# Patient Record
Sex: Male | Born: 1950 | Race: White | Hispanic: No | Marital: Married | Smoking: Never smoker
Health system: Southern US, Community
[De-identification: ages and names within clinical notes are randomized; demographics above are authoritative.]

## PROBLEM LIST (undated history)

## (undated) DIAGNOSIS — N400 Enlarged prostate without lower urinary tract symptoms: Secondary | ICD-10-CM

## (undated) DIAGNOSIS — F419 Anxiety disorder, unspecified: Secondary | ICD-10-CM

## (undated) DIAGNOSIS — I10 Essential (primary) hypertension: Secondary | ICD-10-CM

## (undated) DIAGNOSIS — K2210 Ulcer of esophagus without bleeding: Secondary | ICD-10-CM

## (undated) DIAGNOSIS — G20 Parkinson's disease: Secondary | ICD-10-CM

## (undated) DIAGNOSIS — I219 Acute myocardial infarction, unspecified: Secondary | ICD-10-CM

## (undated) DIAGNOSIS — I2510 Atherosclerotic heart disease of native coronary artery without angina pectoris: Secondary | ICD-10-CM

## (undated) DIAGNOSIS — G2581 Restless legs syndrome: Secondary | ICD-10-CM

## (undated) DIAGNOSIS — R7302 Impaired glucose tolerance (oral): Secondary | ICD-10-CM

## (undated) DIAGNOSIS — R7989 Other specified abnormal findings of blood chemistry: Secondary | ICD-10-CM

## (undated) DIAGNOSIS — F4321 Adjustment disorder with depressed mood: Secondary | ICD-10-CM

## (undated) DIAGNOSIS — K5790 Diverticulosis of intestine, part unspecified, without perforation or abscess without bleeding: Secondary | ICD-10-CM

## (undated) DIAGNOSIS — K219 Gastro-esophageal reflux disease without esophagitis: Secondary | ICD-10-CM

## (undated) DIAGNOSIS — E785 Hyperlipidemia, unspecified: Secondary | ICD-10-CM

## (undated) DIAGNOSIS — G5600 Carpal tunnel syndrome, unspecified upper limb: Secondary | ICD-10-CM

## (undated) DIAGNOSIS — G248 Other dystonia: Secondary | ICD-10-CM

## (undated) DIAGNOSIS — G249 Dystonia, unspecified: Secondary | ICD-10-CM

## (undated) DIAGNOSIS — G5603 Carpal tunnel syndrome, bilateral upper limbs: Secondary | ICD-10-CM

## (undated) DIAGNOSIS — R600 Localized edema: Secondary | ICD-10-CM

## (undated) DIAGNOSIS — Z8719 Personal history of other diseases of the digestive system: Secondary | ICD-10-CM

## (undated) DIAGNOSIS — N289 Disorder of kidney and ureter, unspecified: Secondary | ICD-10-CM

## (undated) DIAGNOSIS — E119 Type 2 diabetes mellitus without complications: Secondary | ICD-10-CM

## (undated) DIAGNOSIS — R1900 Intra-abdominal and pelvic swelling, mass and lump, unspecified site: Secondary | ICD-10-CM

## (undated) DIAGNOSIS — Z87442 Personal history of urinary calculi: Secondary | ICD-10-CM

## (undated) HISTORY — DX: Carpal tunnel syndrome, unspecified upper limb: G56.00

## (undated) HISTORY — PX: NEUROPLASTY / TRANSPOSITION MEDIAN NERVE AT CARPAL TUNNEL: SUR893

## (undated) HISTORY — PX: CORONARY ARTERY BYPASS GRAFT: SHX141

## (undated) HISTORY — DX: Gastro-esophageal reflux disease without esophagitis: K21.9

## (undated) HISTORY — PX: HERNIA REPAIR: SHX51

## (undated) HISTORY — PX: LITHOTRIPSY: SUR834

## (undated) HISTORY — DX: Hyperlipidemia, unspecified: E78.5

## (undated) HISTORY — PX: CARDIAC CATHETERIZATION: SHX172

## (undated) HISTORY — PX: OTHER SURGICAL HISTORY: SHX169

## (undated) HISTORY — DX: Atherosclerotic heart disease of native coronary artery without angina pectoris: I25.10

## (undated) HISTORY — DX: Other dystonia: G24.8

## (undated) HISTORY — DX: Parkinson's disease: G20

## (undated) HISTORY — DX: Restless legs syndrome: G25.81

## (undated) HISTORY — PX: HAND SURGERY: SHX662

---

## 2003-09-30 ENCOUNTER — Other Ambulatory Visit: Payer: Self-pay

## 2007-05-24 ENCOUNTER — Ambulatory Visit: Payer: Self-pay | Admitting: Gastroenterology

## 2008-01-24 ENCOUNTER — Ambulatory Visit: Payer: Self-pay | Admitting: Orthopedic Surgery

## 2008-01-31 ENCOUNTER — Ambulatory Visit: Payer: Self-pay | Admitting: Orthopedic Surgery

## 2009-10-20 ENCOUNTER — Ambulatory Visit: Payer: Self-pay | Admitting: Surgery

## 2009-10-27 ENCOUNTER — Ambulatory Visit: Payer: Self-pay | Admitting: Surgery

## 2010-05-07 ENCOUNTER — Ambulatory Visit: Payer: Self-pay | Admitting: Urology

## 2010-05-25 ENCOUNTER — Ambulatory Visit: Payer: Self-pay | Admitting: Urology

## 2011-03-07 DIAGNOSIS — Z9889 Other specified postprocedural states: Secondary | ICD-10-CM | POA: Insufficient documentation

## 2011-03-07 DIAGNOSIS — G20 Parkinson's disease: Secondary | ICD-10-CM

## 2011-03-07 DIAGNOSIS — G20A1 Parkinson's disease without dyskinesia, without mention of fluctuations: Secondary | ICD-10-CM

## 2011-03-07 DIAGNOSIS — I2510 Atherosclerotic heart disease of native coronary artery without angina pectoris: Secondary | ICD-10-CM

## 2011-03-07 DIAGNOSIS — K219 Gastro-esophageal reflux disease without esophagitis: Secondary | ICD-10-CM

## 2011-03-07 DIAGNOSIS — I10 Essential (primary) hypertension: Secondary | ICD-10-CM | POA: Insufficient documentation

## 2011-03-07 HISTORY — DX: Atherosclerotic heart disease of native coronary artery without angina pectoris: I25.10

## 2011-03-07 HISTORY — DX: Parkinson's disease: G20

## 2011-03-07 HISTORY — DX: Gastro-esophageal reflux disease without esophagitis: K21.9

## 2011-03-07 HISTORY — DX: Parkinson's disease without dyskinesia, without mention of fluctuations: G20.A1

## 2011-03-23 ENCOUNTER — Encounter: Payer: Self-pay | Admitting: Internal Medicine

## 2011-03-25 ENCOUNTER — Encounter: Payer: Self-pay | Admitting: Internal Medicine

## 2011-04-22 ENCOUNTER — Encounter: Payer: Self-pay | Admitting: Internal Medicine

## 2011-04-29 DIAGNOSIS — E785 Hyperlipidemia, unspecified: Secondary | ICD-10-CM

## 2011-04-29 HISTORY — DX: Hyperlipidemia, unspecified: E78.5

## 2011-05-23 ENCOUNTER — Encounter: Payer: Self-pay | Admitting: Internal Medicine

## 2012-03-21 DIAGNOSIS — G2581 Restless legs syndrome: Secondary | ICD-10-CM

## 2012-03-21 HISTORY — DX: Restless legs syndrome: G25.81

## 2012-11-13 ENCOUNTER — Ambulatory Visit: Payer: Self-pay | Admitting: Urology

## 2014-03-12 DIAGNOSIS — M79642 Pain in left hand: Secondary | ICD-10-CM | POA: Insufficient documentation

## 2014-03-12 DIAGNOSIS — G248 Other dystonia: Secondary | ICD-10-CM | POA: Insufficient documentation

## 2014-03-12 HISTORY — DX: Other dystonia: G24.8

## 2014-03-26 DIAGNOSIS — G5600 Carpal tunnel syndrome, unspecified upper limb: Secondary | ICD-10-CM

## 2014-03-26 HISTORY — DX: Carpal tunnel syndrome, unspecified upper limb: G56.00

## 2014-09-01 ENCOUNTER — Other Ambulatory Visit: Payer: Self-pay | Admitting: Gastroenterology

## 2014-09-01 DIAGNOSIS — R1310 Dysphagia, unspecified: Secondary | ICD-10-CM

## 2014-09-03 ENCOUNTER — Ambulatory Visit
Admission: RE | Admit: 2014-09-03 | Discharge: 2014-09-03 | Disposition: A | Discharge status: Home / Self Care | Payer: Managed Care, Other (non HMO) | Source: Ambulatory Visit | Attending: Gastroenterology | Admitting: Gastroenterology

## 2014-09-03 DIAGNOSIS — R1310 Dysphagia, unspecified: Secondary | ICD-10-CM

## 2014-09-03 DIAGNOSIS — K449 Diaphragmatic hernia without obstruction or gangrene: Secondary | ICD-10-CM | POA: Insufficient documentation

## 2014-09-03 DIAGNOSIS — R1314 Dysphagia, pharyngoesophageal phase: Secondary | ICD-10-CM | POA: Diagnosis present

## 2014-09-03 DIAGNOSIS — K228 Other specified diseases of esophagus: Secondary | ICD-10-CM | POA: Diagnosis not present

## 2014-09-26 ENCOUNTER — Encounter: Payer: Self-pay | Admitting: *Deleted

## 2014-09-29 ENCOUNTER — Encounter: Payer: Self-pay | Admitting: *Deleted

## 2014-09-29 ENCOUNTER — Ambulatory Visit
Admission: RE | Admit: 2014-09-29 | Discharge: 2014-09-29 | Disposition: A | Discharge status: Home Health | Payer: Managed Care, Other (non HMO) | Source: Ambulatory Visit | Attending: Gastroenterology | Admitting: Gastroenterology

## 2014-09-29 ENCOUNTER — Ambulatory Visit: Payer: Managed Care, Other (non HMO) | Admitting: Anesthesiology

## 2014-09-29 ENCOUNTER — Encounter
Admission: RE | Disposition: A | Discharge status: Home Health | Payer: Self-pay | Source: Ambulatory Visit | Attending: Gastroenterology

## 2014-09-29 DIAGNOSIS — K222 Esophageal obstruction: Secondary | ICD-10-CM | POA: Diagnosis not present

## 2014-09-29 DIAGNOSIS — Z880 Allergy status to penicillin: Secondary | ICD-10-CM | POA: Diagnosis not present

## 2014-09-29 DIAGNOSIS — K621 Rectal polyp: Secondary | ICD-10-CM | POA: Insufficient documentation

## 2014-09-29 DIAGNOSIS — K317 Polyp of stomach and duodenum: Secondary | ICD-10-CM | POA: Insufficient documentation

## 2014-09-29 DIAGNOSIS — D123 Benign neoplasm of transverse colon: Secondary | ICD-10-CM | POA: Diagnosis not present

## 2014-09-29 DIAGNOSIS — Z1211 Encounter for screening for malignant neoplasm of colon: Secondary | ICD-10-CM | POA: Insufficient documentation

## 2014-09-29 DIAGNOSIS — I10 Essential (primary) hypertension: Secondary | ICD-10-CM | POA: Insufficient documentation

## 2014-09-29 DIAGNOSIS — I2510 Atherosclerotic heart disease of native coronary artery without angina pectoris: Secondary | ICD-10-CM | POA: Insufficient documentation

## 2014-09-29 DIAGNOSIS — R1310 Dysphagia, unspecified: Secondary | ICD-10-CM | POA: Diagnosis present

## 2014-09-29 DIAGNOSIS — G20 Parkinson's disease: Secondary | ICD-10-CM | POA: Insufficient documentation

## 2014-09-29 DIAGNOSIS — E785 Hyperlipidemia, unspecified: Secondary | ICD-10-CM | POA: Diagnosis not present

## 2014-09-29 DIAGNOSIS — K219 Gastro-esophageal reflux disease without esophagitis: Secondary | ICD-10-CM | POA: Diagnosis not present

## 2014-09-29 HISTORY — DX: Essential (primary) hypertension: I10

## 2014-09-29 HISTORY — DX: Restless legs syndrome: G25.81

## 2014-09-29 HISTORY — PX: ESOPHAGOGASTRODUODENOSCOPY (EGD) WITH PROPOFOL: SHX5813

## 2014-09-29 HISTORY — PX: COLONOSCOPY WITH PROPOFOL: SHX5780

## 2014-09-29 HISTORY — DX: Gastro-esophageal reflux disease without esophagitis: K21.9

## 2014-09-29 HISTORY — DX: Atherosclerotic heart disease of native coronary artery without angina pectoris: I25.10

## 2014-09-29 HISTORY — DX: Parkinson's disease: G20

## 2014-09-29 HISTORY — DX: Hyperlipidemia, unspecified: E78.5

## 2014-09-29 SURGERY — COLONOSCOPY WITH PROPOFOL
Anesthesia: General

## 2014-09-29 MED ORDER — SODIUM CHLORIDE 0.9 % IV SOLN: INTRAVENOUS | Status: DC

## 2014-09-29 MED ORDER — PROPOFOL INFUSION 10 MG/ML OPTIME
INTRAVENOUS | Status: DC | PRN
  Administered 2014-09-29: 140 ug/kg/min via INTRAVENOUS

## 2014-09-29 MED ORDER — GLYCOPYRROLATE 0.2 MG/ML IJ SOLN
INTRAMUSCULAR | Status: DC | PRN
  Administered 2014-09-29: 0.2 mg via INTRAVENOUS

## 2014-09-29 MED ORDER — SODIUM CHLORIDE 0.9 % IV SOLN
INTRAVENOUS | Status: DC
  Administered 2014-09-29: 14:00:00 via INTRAVENOUS

## 2014-09-29 MED ORDER — EPHEDRINE SULFATE 50 MG/ML IJ SOLN
INTRAMUSCULAR | Status: DC | PRN
  Administered 2014-09-29: 5 mg via INTRAVENOUS
  Administered 2014-09-29 (×2): 10 mg via INTRAVENOUS

## 2014-09-29 MED ORDER — FENTANYL CITRATE (PF) 100 MCG/2ML IJ SOLN
INTRAMUSCULAR | Status: DC | PRN
  Administered 2014-09-29: 50 ug via INTRAVENOUS

## 2014-09-29 NOTE — Anesthesia Preprocedure Evaluation (Addendum)
Anesthesia Evaluation  Patient identified by MRN, date of birth, ID band Patient awake    Reviewed: Allergy & Precautions, H&P , NPO status , Patient's Chart, lab work & pertinent test results, reviewed documented beta blocker date and time   History of Anesthesia Complications Negative for: history of anesthetic complications  Airway Mallampati: II  TM Distance: >3 FB Neck ROM: full    Dental no notable dental hx. (+) Partial Upper, Partial Lower   Pulmonary neg pulmonary ROS,  breath sounds clear to auscultation  Pulmonary exam normal       Cardiovascular Exercise Tolerance: Good hypertension, On Medications and On Home Beta Blockers - angina+ CAD, + Past MI and + CABG - Cardiac Stents and - CHF Normal cardiovascular exam- Valvular Problems/MurmursRhythm:regular Rate:Normal  Known RBBB   Neuro/Psych Parkinson's disease and Restless leg syndrome negative psych ROS   GI/Hepatic Neg liver ROS, GERD-  Medicated and Controlled,  Endo/Other  negative endocrine ROS  Renal/GU negative Renal ROS  negative genitourinary   Musculoskeletal   Abdominal   Peds  Hematology negative hematology ROS (+)   Anesthesia Other Findings Past Medical History:   Coronary artery disease                                      Hypertension                                                 GERD (gastroesophageal reflux disease)                       Hyperlipidemia                                               Parkinson disease                                            RLS (restless legs syndrome)                                 Reproductive/Obstetrics negative OB ROS                            Anesthesia Physical Anesthesia Plan  ASA: III  Anesthesia Plan: General   Post-op Pain Management:    Induction:   Airway Management Planned:   Additional Equipment:   Intra-op Plan:   Post-operative Plan:    Informed Consent: I have reviewed the patients History and Physical, chart, labs and discussed the procedure including the risks, benefits and alternatives for the proposed anesthesia with the patient or authorized representative who has indicated his/her understanding and acceptance.   Dental Advisory Given  Plan Discussed with: Anesthesiologist, CRNA and Surgeon  Anesthesia Plan Comments:         Anesthesia Quick Evaluation

## 2014-09-29 NOTE — Transfer of Care (Signed)
Immediate Anesthesia Transfer of Care Note  Patient: Christian Thompson  Procedure(s) Performed: Procedure(s): COLONOSCOPY WITH PROPOFOL (N/A) ESOPHAGOGASTRODUODENOSCOPY (EGD) WITH PROPOFOL (N/A)  Patient Location: PACU and Endoscopy Unit  Anesthesia Type:General  Level of Consciousness: sedated and responds to stimulation  Airway & Oxygen Therapy: Patient Spontanous Breathing and Patient connected to nasal cannula oxygen  Post-op Assessment: Report given to RN and Post -op Vital signs reviewed and stable  Post vital signs: Reviewed  Last Vitals:  Filed Vitals:   09/29/14 1608  BP: 97/59  Pulse: 57  Temp: 36.4 C  Resp: 16    Complications: No apparent anesthesia complications

## 2014-09-29 NOTE — Anesthesia Postprocedure Evaluation (Signed)
  Anesthesia Post-op Note  Patient: Christian Thompson  Procedure(s) Performed: Procedure(s): COLONOSCOPY WITH PROPOFOL (N/A) ESOPHAGOGASTRODUODENOSCOPY (EGD) WITH PROPOFOL (N/A)  Anesthesia type:General  Patient location: PACU  Post pain: Pain level controlled  Post assessment: Post-op Vital signs reviewed, Patient's Cardiovascular Status Stable, Respiratory Function Stable, Patent Airway and No signs of Nausea or vomiting  Post vital signs: Reviewed and stable  Last Vitals:  Filed Vitals:   09/29/14 1640  BP: 94/66  Pulse: 53  Temp:   Resp: 20    Level of consciousness: awake, alert  and patient cooperative  Complications: No apparent anesthesia complications

## 2014-09-29 NOTE — Op Note (Signed)
[facility] Gastroenterology Patient Name: Christian Thompson Procedure Date: 09/29/2014 2:42 PM MRN: [id number] Account #: [id number] Date of Birth: October 31, 1950 Admit Type: Outpatient Age: 64 Room: [facility] ENDO ROOM 3 Gender: Male Note Status: Finalized Procedure:         Colonoscopy Indications:       This is the patient's first colonoscopy Providers:         [name], MD Referring MD:      Shirline Frees (Referring MD) Medicines:         Monitored Anesthesia Care Complications:     No immediate complications. Procedure:         Pre-Anesthesia Assessment:                    - ASA Grade Assessment: III - A patient with severe                     systemic disease.                    After obtaining informed consent, the colonoscope was                     passed under direct vision. Throughout the procedure, the                     patient's blood pressure, pulse, and oxygen saturations                     were monitored continuously. The Olympus PCF-H180AL                     colonoscope ( S#: [id number] ) was introduced through the                     anus and advanced to the the cecum, identified by                     appendiceal orifice and ileocecal valve. The colonoscopy                     was performed without difficulty. The patient tolerated                     the procedure well. The quality of the bowel preparation                     was fair. Findings:      Multiple small-mouthed diverticula were found in the sigmoid colon and       in the distal descending colon.      A single medium-mouthed diverticulum was found at the splenic flexure.      A 5 mm polyp was found in the descending colon. The polyp was sessile.       The polyp was removed with a cold snare. Resection and retrieval were       complete.      A 2 mm polyp was found in the proximal sigmoid colon. The polyp was       sessile. The polyp was removed with a cold biopsy forceps.  Resection and       retrieval were complete.      Four sessile polyps were found in the distal sigmoid colon. The polyps       were 1 to 3 mm in  size. These polyps were removed with a cold biopsy       forceps. Resection and retrieval were complete.      A 3 mm polyp was found in the rectum. The polyp was sessile. The polyp       was removed with a cold biopsy forceps. Resection and retrieval were       complete.      The digital rectal exam was normal.      The retroflexed view of the distal rectum and anal verge was normal and       showed no anal or rectal abnormalities. Impression:        - Diverticulosis in the sigmoid colon and in the distal                     descending colon.                    - Diverticulosis at the splenic flexure.                    - One 5 mm polyp in the descending colon. Resected and                     retrieved.                    - One 2 mm polyp in the proximal sigmoid colon. Resected                     and retrieved.                    - Four 1 to 3 mm polyps in the distal sigmoid colon.                     Resected and retrieved.                    - One 3 mm polyp in the rectum. Resected and retrieved.                    - The distal rectum and anal verge are normal on                     retroflexion view. Recommendation:    - Await pathology results.                    - Telephone GI clinic for pathology results in 1 week.                    - Perform a modified barium swallow at appointment to be                     scheduled. Procedure Code(s): --- Professional ---                    [phone], Colonoscopy, flexible; with removal of tumor(s),                     polyp(s), or other lesion(s) by snare technique                    69485, 70, Colonoscopy, flexible; with biopsy, single or  multiple Diagnosis Code(s): --- Professional ---                    211.3, Benign neoplasm of colon                    569.0, Anal and rectal  polyp                    562.10, Diverticulosis of colon (without mention of                     hemorrhage) CPT copyright 2014 American Medical Association. All rights reserved. The codes documented in this report are preliminary and upon coder review may  be revised to meet current compliance requirements. [name], MD 09/29/2014 4:07:20 PM This report has been signed electronically. Number of Addenda: 0 Note Initiated On: 09/29/2014 2:42 PM Scope Withdrawal Time: 0 hours 16 minutes 31 seconds  Total Procedure Duration: 0 hours 26 minutes 3 seconds       [facility]

## 2014-09-29 NOTE — Op Note (Signed)
[facility] Gastroenterology Patient Name: Christian Thompson Procedure Date: 09/29/2014 2:43 PM MRN: [id number] Account #: [id number] Date of Birth: [birth year] Admit Type: Outpatient Age: 64 Room: [facility] ENDO ROOM 3 Gender: Male Note Status: Finalized Procedure:         Upper GI endoscopy Indications:       Dysphagia Providers:         [name], MD Referring MD:      Shirline Frees (Referring MD) Medicines:         Monitored Anesthesia Care Complications:     No immediate complications. Procedure:         Pre-Anesthesia Assessment:                    - ASA Grade Assessment: III - A patient with severe                     systemic disease.                    After obtaining informed consent, the endoscope was passed                     under direct vision. Throughout the procedure, the                     patient's blood pressure, pulse, and oxygen saturations                     were monitored continuously. The Olympus GIF-160 endoscope                     (S#. [id number]) was introduced through the mouth, and                     advanced to the third part of duodenum. The upper GI                     endoscopy was accomplished without difficulty. The patient                     tolerated the procedure well. Findings:      Abnormal motility was noted in the middle third of the esophagus and in       the lower third of the esophagus. The cricopharyngeus was normal. There       is spasticity of the esophageal body. The distal esophagus/lower       esophageal sphincter is spastic, but gives up passage to the endoscope.       Tertiary peristaltic waves are noted.      A low-grade of narrowing Schatzki ring (acquired) was found at the       gastroesophageal junction. A TTS dilator was passed through the scope.       Dilation with a 12-13.5-15 mm balloon (to a maximum balloon size of 15       mm) dilator was performed.      LA Grade A (one or more mucosal breaks  less than 5 mm, not extending       between tops of 2 mucosal folds) esophagitis with no bleeding was found.       Biopsies were taken with a cold forceps for histology.      A single 11 mm pedunculated polyp with erythema and no stigmata of       recent bleeding  was found on the posterior wall of the gastric body.       Biopsies were taken with a cold forceps for histology. The polyp was       removed with a cold biopsy forceps. Resection and retrieval were       complete. One hemostatic clip was successfully placed.      A small hiatus hernia was found. The Z-line was a variable distance from       incisors; the hiatal hernia was sliding. 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                    - Low-grade of narrowing Schatzki ring. Dilated.                    - LA Grade A erosive esophagitis. Biopsied.                    - A single gastric polyp. Resected and retrieved.                     Biopsied. Clip was placed.                    - Small hiatus hernia. Recommendation:    - Perform a colonoscopy today.                    - Use Protonix (pantoprazole) 40 mg PO BID for 1 week.                    - Use Protonix (pantoprazole) 40 mg PO daily daily.                    - Return to GI clinic in 4 weeks. Procedure Code(s): --- Professional ---                    [phone], Esophagogastroduodenoscopy, flexible, transoral;                     with transendoscopic balloon dilation of esophagus (less                     than 30 mm diameter)                    43239, Esophagogastroduodenoscopy, flexible, transoral;                     with biopsy, single or multiple Diagnosis Code(s): --- Professional ---                    530.3, Stricture and stenosis of esophagus                    530.19, Other esophagitis                    211.1, Benign neoplasm of stomach                    787.20, Dysphagia, unspecified                     553.3, Diaphragmatic hernia without mention of obstruction  or gangrene CPT copyright 2014 American Medical Association. All rights reserved. The codes documented in this report are preliminary and upon coder review may  be revised to meet current compliance requirements. [name], MD 09/29/2014 3:32:09 PM This report has been signed electronically. Number of Addenda: 0 Note Initiated On: 09/29/2014 2:43 PM      [facility]

## 2014-09-30 ENCOUNTER — Encounter: Payer: Self-pay | Admitting: Gastroenterology

## 2014-09-30 NOTE — H&P (Signed)
Outpatient short stay form Pre-procedure 09/30/2014 5:27 PM Christian Sails MD  Primary Physician: Dr [name]  Reason for visit:  EGD and colonoscopy  History of present illness:  Patient is a 64 year old male setting for EGD and colonoscopy. His is in regards to issues with dysphagia and GERD as well as colon cancer screening. He has a history of or Parkinson's disease he tolerated his prep well. He takes no anticoagulates or aspirin-type medications.   No current facility-administered medications for this encounter.  Current outpatient prescriptions:  .  amitriptyline (ELAVIL) 10 MG tablet, Take 10 mg by mouth at bedtime., Disp: , Rfl:  .  atorvastatin (LIPITOR) 80 MG tablet, Take 80 mg by mouth daily., Disp: , Rfl:  .  carbidopa-levodopa (SINEMET IR) 25-100 MG per tablet, Take 1 tablet by mouth 3 (three) times daily., Disp: , Rfl:  .  citalopram (CELEXA) 10 MG tablet, Take 10 mg by mouth daily., Disp: , Rfl:  .  cyanocobalamin 1000 MCG tablet, Take 100 mcg by mouth daily., Disp: , Rfl:  .  furosemide (LASIX) 20 MG tablet, Take 20 mg by mouth., Disp: , Rfl:  .  glucosamine-chondroitin 500-400 MG tablet, Take 1 tablet by mouth 3 (three) times daily., Disp: , Rfl:  .  lisinopril (PRINIVIL,ZESTRIL) 5 MG tablet, Take 5 mg by mouth daily., Disp: , Rfl:  .  metoprolol succinate (TOPROL-XL) 25 MG 24 hr tablet, Take 25 mg by mouth daily., Disp: , Rfl:  .  Multiple Vitamin (MULTIVITAMIN) tablet, Take 1 tablet by mouth daily., Disp: , Rfl:  .  nitroGLYCERIN (NITROSTAT) 0.4 MG SL tablet, Place 0.4 mg under the tongue every 5 (five) minutes as needed for chest pain., Disp: , Rfl:  .  omega-3 acid ethyl esters (LOVAZA) 1 G capsule, Take by mouth 2 (two) times daily., Disp: , Rfl:  .  pantoprazole (PROTONIX) 40 MG tablet, Take 40 mg by mouth daily., Disp: , Rfl:  .  rOPINIRole (REQUIP) 0.5 MG tablet, Take 0.5 mg by mouth 3 (three) times daily., Disp: , Rfl:  .  vitamin C (ASCORBIC ACID) 500 MG  tablet, Take 500 mg by mouth daily., Disp: , Rfl:   No prescriptions prior to admission     Allergies  Allergen Reactions  . Penicillins   . Tramadol      Past Medical History  Diagnosis Date  . Coronary artery disease   . Hypertension   . GERD (gastroesophageal reflux disease)   . Hyperlipidemia   . Parkinson disease   . RLS (restless legs syndrome)     Review of systems:      Physical Exam    Heart and lungs: Bilaterally clear to auscultation, regular rate and rhythm without rub or gallop    HEENT: Norm cephalic atraumatic eyes are anicteric    Other:     Pertinant exam for procedure: Soft nontender nondistended bowel sounds positive normoactive    Planned proceedures: EGD, colonoscopy and indicated procedures. I have discussed the risks benefits and complications of procedures to include not limited to bleeding, infection, perforation and the risk of sedation and the patient wishes to proceed.    Christian Sails, MD Gastroenterology 09/30/2014  5:27 PM

## 2014-10-01 LAB — SURGICAL PATHOLOGY

## 2014-10-06 ENCOUNTER — Other Ambulatory Visit: Payer: Self-pay | Admitting: Gastroenterology

## 2014-10-06 DIAGNOSIS — K228 Other specified diseases of esophagus: Secondary | ICD-10-CM

## 2014-10-06 DIAGNOSIS — K2289 Other specified disease of esophagus: Secondary | ICD-10-CM

## 2014-10-15 ENCOUNTER — Ambulatory Visit
Admission: RE | Admit: 2014-10-15 | Discharge: 2014-10-15 | Disposition: A | Discharge status: Home / Self Care | Payer: Managed Care, Other (non HMO) | Source: Ambulatory Visit | Attending: Gastroenterology | Admitting: Gastroenterology

## 2014-10-15 DIAGNOSIS — K2289 Other specified disease of esophagus: Secondary | ICD-10-CM

## 2014-10-15 DIAGNOSIS — R1314 Dysphagia, pharyngoesophageal phase: Secondary | ICD-10-CM

## 2014-10-15 DIAGNOSIS — R1310 Dysphagia, unspecified: Secondary | ICD-10-CM | POA: Diagnosis not present

## 2014-10-15 DIAGNOSIS — K228 Other specified diseases of esophagus: Secondary | ICD-10-CM

## 2014-10-15 NOTE — Therapy (Signed)
[place], [place], [address] Phone: [phone]   Fax:     Modified Barium Swallow  Patient Details  Name: Christian Thompson MRN: [id number] Date of Birth: 1950-04-29 Referring Provider:  [name], MD  Encounter Date: 10/15/2014      End of Session - 10/15/14 1333    Visit Number 1   Number of Visits 1   Date for SLP Re-Evaluation 10/15/14   SLP Start Time 41   SLP Stop Time  1310   SLP Time Calculation (min) 40 min   Activity Tolerance Patient tolerated treatment well      Past Medical History  Diagnosis Date  . Coronary artery disease   . Hypertension   . GERD (gastroesophageal reflux disease)   . Hyperlipidemia   . Parkinson disease   . RLS (restless legs syndrome)     Past Surgical History  Procedure Laterality Date  . Hernia repair    . Cardiac catheterization    . Coronary artery bypass graft    . Lithotripsy    . Hemmorrhoidectomy    . Hand surgery    . Neuroplasty / transposition median nerve at carpal tunnel Left   . Colonoscopy with propofol N/A 09/29/2014    Procedure: COLONOSCOPY WITH PROPOFOL;  Surgeon: [name], MD;  Location: [facility] ENDOSCOPY;  Service: Endoscopy;  Laterality: N/A;  . Esophagogastroduodenoscopy (egd) with propofol N/A 09/29/2014    Procedure: ESOPHAGOGASTRODUODENOSCOPY (EGD) WITH PROPOFOL;  Surgeon: [name], MD;  Location: [facility] ENDOSCOPY;  Service: Endoscopy;  Laterality: N/A;    There were no vitals filed for this visit.  Visit Diagnosis: Dysphagia, pharyngoesophageal phase  Presbyesophagus - Plan: DG SWALLOWING FUNC-SPEECH PATHOLOGY, DG SWALLOWING FUNC-SPEECH PATHOLOGY   Subjective: Patient behavior: (alertness, ability to follow instructions, etc.): Pt was pleasant and cooperative with evaluation.  Chief complaint: Pt reports that he coughs while eating and drinking and that it sometimes feels as if food becomes  lodged (indicated upper chest).   Objective:  Radiological Procedure: A videoflouroscopic evaluation of oral-preparatory, reflex initiation, and pharyngeal phases of the swallow was performed; as well as a screening of the upper esophageal phase.  I. POSTURE: Upright in flouro chair II. VIEW: Lateral and A-P III. COMPENSATORY STRATEGIES: None indicated IV. BOLUSES ADMINISTERED:  Thin Liquid: 4 via cup and straw  Nectar-thick Liquid: 3 via cup  Honey-thick Liquid: Not tested  Puree: 3 tsps  Mechanical Soft: 1 cracker  BA pill: x1 V. RESULTS OF EVALUATION: A. ORAL PREPARATORY PHASE: (The lips, tongue, and velum are observed for strength and coordination) WFL for all tested consistencies       **Overall Severity Rating: WFL  B. SWALLOW INITIATION/REFLEX: (The reflex is normal if "triggered" by the time the bolus reached the base of the tongue) Swallow trigger at base of tongue for all tested consistencies  **Overall Severity Rating: WFL  C. PHARYNGEAL PHASE: (Pharyngeal function is normal if the bolus shows rapid, smooth, and continuous transit through the pharynx and there is no pharyngeal residue after the swallow)   **Overall Severity Rating: [facility] for all tested consistencies  D. LARYNGEAL PENETRATION: (Material entering into the laryngeal inlet/vestibule but not aspirated) None observed E. ASPIRATION: No aspiration observed F. ESOPHAGEAL PHASE: (Screening of the upper esophagus) Noted slow clearing of boluses through esophagus as well as poor clearing of BA pill. Pt was able to clear pill w/ f/u sips of liquid  ASSESSMENT: Pt presents w/oropharyngeal swallow  WFL and min risk of aspiration. No aspiration or laryngeal penetration was observed w/any tested consistency. Oral phase was [facility]. No pocketing or holding was observed and A-P transit was timely. Mastication of solid was adequate. No swallow delay was observed and no significant pharyngeal residue was observed. During screening of  upper esophagus it was observed that pt had slow clearing of boluses through esophagus and BA pill did not clear into stomach well. Pt was able to clear pill after several subsequent swallows of liquid. Recommend continue w/regular diet w/thin liquids. Discussed strategies with pt to aid in decreasing esophageal dysphagia including checking to see if his medications would be able to be crushed.  PLAN/RECOMMENDATIONS:  A. Diet: Regular w/thin  B. Swallowing Precautions: Check meds for crushability and give in puree, sit fully upright for at least 60 minutes post meal, small bites/sips, eat slowly.  C. Recommended continue f/u w/GI  D. Therapy recommendations not indicated  E. Results and recommendations were discussed w/pt.                              Problem List There are no active problems to display for this patient.   [place] 10/15/2014, 1:39 PM  [facility] [place], [place], [address] Phone: [phone]   Fax:

## 2015-05-13 ENCOUNTER — Other Ambulatory Visit: Payer: Self-pay | Admitting: Gastroenterology

## 2015-05-13 DIAGNOSIS — R1310 Dysphagia, unspecified: Secondary | ICD-10-CM

## 2015-05-20 ENCOUNTER — Ambulatory Visit
Admission: RE | Admit: 2015-05-20 | Discharge: 2015-05-20 | Disposition: A | Discharge status: Home / Self Care | Payer: Medicare Other | Source: Ambulatory Visit | Attending: Gastroenterology | Admitting: Gastroenterology

## 2015-05-20 DIAGNOSIS — K449 Diaphragmatic hernia without obstruction or gangrene: Secondary | ICD-10-CM | POA: Diagnosis not present

## 2015-05-20 DIAGNOSIS — K219 Gastro-esophageal reflux disease without esophagitis: Secondary | ICD-10-CM | POA: Insufficient documentation

## 2015-05-20 DIAGNOSIS — K222 Esophageal obstruction: Secondary | ICD-10-CM | POA: Diagnosis not present

## 2015-05-20 DIAGNOSIS — R1310 Dysphagia, unspecified: Secondary | ICD-10-CM | POA: Diagnosis not present

## 2015-05-20 DIAGNOSIS — R05 Cough: Secondary | ICD-10-CM | POA: Diagnosis not present

## 2015-05-28 ENCOUNTER — Ambulatory Visit
Admit: 2015-05-28 | Discharge: 2015-05-28 | Disposition: A | Discharge status: Home / Self Care | Payer: Medicare Other | Attending: Family Medicine | Admitting: Family Medicine

## 2015-05-28 ENCOUNTER — Ambulatory Visit
Admission: EM | Admit: 2015-05-28 | Discharge: 2015-05-28 | Disposition: A | Discharge status: Home / Self Care | Payer: Medicare Other | Attending: Family Medicine | Admitting: Family Medicine

## 2015-05-28 ENCOUNTER — Encounter: Payer: Self-pay | Admitting: *Deleted

## 2015-05-28 DIAGNOSIS — K219 Gastro-esophageal reflux disease without esophagitis: Secondary | ICD-10-CM | POA: Insufficient documentation

## 2015-05-28 DIAGNOSIS — R1032 Left lower quadrant pain: Secondary | ICD-10-CM | POA: Diagnosis not present

## 2015-05-28 DIAGNOSIS — G20 Parkinson's disease: Secondary | ICD-10-CM | POA: Diagnosis not present

## 2015-05-28 DIAGNOSIS — N201 Calculus of ureter: Secondary | ICD-10-CM | POA: Diagnosis not present

## 2015-05-28 DIAGNOSIS — R310 Gross hematuria: Secondary | ICD-10-CM | POA: Insufficient documentation

## 2015-05-28 DIAGNOSIS — Z951 Presence of aortocoronary bypass graft: Secondary | ICD-10-CM | POA: Diagnosis not present

## 2015-05-28 DIAGNOSIS — N132 Hydronephrosis with renal and ureteral calculous obstruction: Secondary | ICD-10-CM | POA: Diagnosis not present

## 2015-05-28 DIAGNOSIS — G2581 Restless legs syndrome: Secondary | ICD-10-CM | POA: Diagnosis not present

## 2015-05-28 DIAGNOSIS — Z9889 Other specified postprocedural states: Secondary | ICD-10-CM | POA: Diagnosis not present

## 2015-05-28 DIAGNOSIS — Z87442 Personal history of urinary calculi: Secondary | ICD-10-CM | POA: Insufficient documentation

## 2015-05-28 DIAGNOSIS — Z79899 Other long term (current) drug therapy: Secondary | ICD-10-CM | POA: Insufficient documentation

## 2015-05-28 DIAGNOSIS — N400 Enlarged prostate without lower urinary tract symptoms: Secondary | ICD-10-CM | POA: Diagnosis not present

## 2015-05-28 DIAGNOSIS — I10 Essential (primary) hypertension: Secondary | ICD-10-CM | POA: Insufficient documentation

## 2015-05-28 DIAGNOSIS — I700 Atherosclerosis of aorta: Secondary | ICD-10-CM | POA: Diagnosis not present

## 2015-05-28 DIAGNOSIS — Z888 Allergy status to other drugs, medicaments and biological substances status: Secondary | ICD-10-CM | POA: Insufficient documentation

## 2015-05-28 DIAGNOSIS — Z880 Allergy status to penicillin: Secondary | ICD-10-CM | POA: Diagnosis not present

## 2015-05-28 DIAGNOSIS — E785 Hyperlipidemia, unspecified: Secondary | ICD-10-CM | POA: Insufficient documentation

## 2015-05-28 DIAGNOSIS — N390 Urinary tract infection, site not specified: Secondary | ICD-10-CM

## 2015-05-28 DIAGNOSIS — I2510 Atherosclerotic heart disease of native coronary artery without angina pectoris: Secondary | ICD-10-CM | POA: Insufficient documentation

## 2015-05-28 DIAGNOSIS — N200 Calculus of kidney: Secondary | ICD-10-CM | POA: Diagnosis not present

## 2015-05-28 DIAGNOSIS — R109 Unspecified abdominal pain: Secondary | ICD-10-CM | POA: Diagnosis present

## 2015-05-28 HISTORY — DX: Disorder of kidney and ureter, unspecified: N28.9

## 2015-05-28 LAB — CBC WITH DIFFERENTIAL/PLATELET
BASOS PCT: 0 %
Basophils Absolute: 0 10*3/uL (ref 0–0.1)
EOS ABS: 0 10*3/uL (ref 0–0.7)
EOS PCT: 0 %
HCT: 43.8 % (ref 40.0–52.0)
Hemoglobin: 14.8 g/dL (ref 13.0–18.0)
LYMPHS ABS: 1.1 10*3/uL (ref 1.0–3.6)
Lymphocytes Relative: 9 %
MCH: 29.6 pg (ref 26.0–34.0)
MCHC: 33.8 g/dL (ref 32.0–36.0)
MCV: 87.5 fL (ref 80.0–100.0)
MONO ABS: 0.4 10*3/uL (ref 0.2–1.0)
MONOS PCT: 3 %
Neutro Abs: 10.6 10*3/uL — ABNORMAL HIGH (ref 1.4–6.5)
Neutrophils Relative %: 88 %
PLATELETS: 150 10*3/uL (ref 150–440)
RBC: 5 MIL/uL (ref 4.40–5.90)
RDW: 13.5 % (ref 11.5–14.5)
WBC: 12.1 10*3/uL — ABNORMAL HIGH (ref 3.8–10.6)

## 2015-05-28 LAB — COMPREHENSIVE METABOLIC PANEL
ALK PHOS: 76 U/L (ref 38–126)
ALT: 13 U/L — ABNORMAL LOW (ref 17–63)
ANION GAP: 5 (ref 5–15)
AST: 30 U/L (ref 15–41)
Albumin: 4.1 g/dL (ref 3.5–5.0)
BUN: 16 mg/dL (ref 6–20)
CALCIUM: 8.8 mg/dL — AB (ref 8.9–10.3)
CHLORIDE: 107 mmol/L (ref 101–111)
CO2: 26 mmol/L (ref 22–32)
Creatinine, Ser: 1.13 mg/dL (ref 0.61–1.24)
GFR calc non Af Amer: >60 mL/min (ref >60)
Glucose, Bld: 164 mg/dL — ABNORMAL HIGH (ref 65–99)
POTASSIUM: 3.6 mmol/L (ref 3.5–5.1)
SODIUM: 138 mmol/L (ref 135–145)
Total Bilirubin: 1.3 mg/dL — ABNORMAL HIGH (ref 0.3–1.2)
Total Protein: 7.2 g/dL (ref 6.5–8.1)

## 2015-05-28 LAB — URINALYSIS COMPLETE WITH MICROSCOPIC (ARMC ONLY)
BILIRUBIN URINE: NEGATIVE
GLUCOSE, UA: NEGATIVE mg/dL
Ketones, ur: NEGATIVE mg/dL
Leukocytes, UA: NEGATIVE
NITRITE: NEGATIVE
Specific Gravity, Urine: >1.03 — ABNORMAL HIGH (ref 1.005–1.030)
pH: 6 (ref 5.0–8.0)

## 2015-05-28 LAB — AMYLASE: AMYLASE: 83 U/L (ref 28–100)

## 2015-05-28 LAB — LIPASE, BLOOD: Lipase: 25 U/L (ref 11–51)

## 2015-05-28 MED ORDER — OXYCODONE-ACETAMINOPHEN 10-325 MG PO TABS: 1.0000 | ORAL_TABLET | Freq: Three times a day (TID) | ORAL | Status: DC | PRN

## 2015-05-28 MED ORDER — KETOROLAC TROMETHAMINE 60 MG/2ML IM SOLN
60.0000 mg | Freq: Once | INTRAMUSCULAR | Status: AC
  Administered 2015-05-28: 60 mg via INTRAMUSCULAR

## 2015-05-28 MED ORDER — ONDANSETRON 8 MG PO TBDP: 8.0000 mg | ORAL_TABLET | Freq: Three times a day (TID) | ORAL | Status: DC | PRN

## 2015-05-28 MED ORDER — CIPROFLOXACIN HCL 500 MG PO TABS: 500.0000 mg | ORAL_TABLET | Freq: Two times a day (BID) | ORAL | Status: DC

## 2015-05-28 NOTE — Discharge Instructions (Signed)
Kidney Stones Kidney stones (urolithiasis) are solid masses that form inside your kidneys. The intense pain is caused by the stone moving through the kidney, ureter, bladder, and urethra (urinary tract). When the stone moves, the ureter starts to spasm around the stone. The stone is usually passed in your pee (urine).  HOME CARE  Drink enough fluids to keep your pee clear or pale yellow. This helps to get the stone out.  Take a 24-hour pee (urine) sample as told by your doctor. You may need to take another sample every 6-12 months.  Strain all pee through the provided strainer. Do not pee without peeing through the strainer, not even once. If you pee the stone out, catch it in the strainer. The stone may be as small as a grain of salt. Take this to your doctor. This will help your doctor figure out what you can do to try to prevent more kidney stones.  Only take medicine as told by your doctor.  Make changes to your daily diet as told by your doctor. You may be told to:  Limit how much salt you eat.  Eat 5 or more servings of fruits and vegetables each day.  Limit how much meat, poultry, fish, and eggs you eat.  Keep all follow-up visits as told by your doctor. This is important.  Get follow-up X-rays as told by your doctor. GET HELP IF: You have pain that gets worse even if you have been taking pain medicine. GET HELP RIGHT AWAY IF:   Your pain does not get better with medicine.  You have a fever or shaking chills.  Your pain increases and gets worse over 18 hours.  You have new belly (abdominal) pain.  You feel faint or pass out.  You are unable to pee.   This information is not intended to replace advice given to you by your health care provider. Make sure you discuss any questions you have with your health care provider.   Document Released: 07/27/2007 Document Revised: 10/29/2014 Document Reviewed: 07/11/2012 Elsevier Interactive Patient Education 2016 Elsevier  Inc.  Urinary Tract Infection A urinary tract infection (UTI) can occur any place along the urinary tract. The tract includes the kidneys, ureters, bladder, and urethra. A type of germ called bacteria often causes a UTI. UTIs are often helped with antibiotic medicine.  HOME CARE   If given, take antibiotics as told by your doctor. Finish them even if you start to feel better.  Drink enough fluids to keep your pee (urine) clear or pale yellow.  Avoid tea, drinks with caffeine, and bubbly (carbonated) drinks.  Pee often. Avoid holding your pee in for a long time.  Pee before and after having sex (intercourse).  Wipe from front to back after you poop (bowel movement) if you are a woman. Use each tissue only once. GET HELP RIGHT AWAY IF:   You have back pain.  You have lower belly (abdominal) pain.  You have chills.  You feel sick to your stomach (nauseous).  You throw up (vomit).  Your burning or discomfort with peeing does not go away.  You have a fever.  Your symptoms are not better in 3 days. MAKE SURE YOU:   Understand these instructions.  Will watch your condition.  Will get help right away if you are not doing well or get worse.   This information is not intended to replace advice given to you by your health care provider. Make sure you discuss any questions  you have with your health care provider.   Document Released: 07/27/2007 Document Revised: 02/28/2014 Document Reviewed: 09/08/2011 Elsevier Interactive Patient Education [employer].

## 2015-05-28 NOTE — ED Notes (Signed)
Patient started having left flank pain this am. Patient does have a history of kidney stones.

## 2015-05-28 NOTE — ED Provider Notes (Signed)
CSN: [id number]     Arrival date & time 05/28/15  1006 History   First MD Initiated Contact with Patient 05/28/15 1059    Nurses notes were reviewed. Chief Complaint  Patient presents with  . Flank Pain   Patient with left sided flank pain that started about 3:00 this morning. He has a history of Parkinson disease and a history of recurrent kidney stones. His wife reports that about 2 years ago before Dr. Shade Flood retired he had a huge bladder stone took several rounds of lithotripsy to finally break and he vomited up having to have a tube for a while due to the amount of trauma. She states that was a large bladder stone the hand at the time. He's had coronary artery disease and has had a CABG he has hypertension hyperlipidemia as well. He does not smoke and obviously is no pertinent family medical history related to his flank pain and possible stone. He is allergic to penicillin and tramadol    (Consider location/radiation/quality/duration/timing/severity/associated sxs/prior Treatment) Patient is a 65 y.o. male presenting with flank pain. The history is provided by the patient and the spouse. No language interpreter was used.  Flank Pain This is a new problem. The current episode started 6 to 12 hours ago. The problem has been gradually worsening. Associated symptoms include abdominal pain. Pertinent negatives include no chest pain, no headaches and no shortness of breath. Nothing aggravates the symptoms. He has tried nothing for the symptoms. The treatment provided no relief.    Past Medical History  Diagnosis Date  . Coronary artery disease   . Hypertension   . GERD (gastroesophageal reflux disease)   . Hyperlipidemia   . Parkinson disease ([place])   . RLS (restless legs syndrome)   . Renal disorder    Past Surgical History  Procedure Laterality Date  . Hernia repair    . Cardiac catheterization    . Coronary artery bypass graft    . Lithotripsy    . Hemmorrhoidectomy    . Hand  surgery    . Neuroplasty / transposition median nerve at carpal tunnel Left   . Colonoscopy with propofol N/A 09/29/2014    Procedure: COLONOSCOPY WITH PROPOFOL;  Surgeon: [name], MD;  Location: [facility] ENDOSCOPY;  Service: Endoscopy;  Laterality: N/A;  . Esophagogastroduodenoscopy (egd) with propofol N/A 09/29/2014    Procedure: ESOPHAGOGASTRODUODENOSCOPY (EGD) WITH PROPOFOL;  Surgeon: [name], MD;  Location: [facility] ENDOSCOPY;  Service: Endoscopy;  Laterality: N/A;   History reviewed. No pertinent family history. Social History  Substance Use Topics  . Smoking status: Never Smoker   . Smokeless tobacco: Former Systems developer    Quit date: 02/26/2007  . Alcohol Use: No    Review of Systems  Respiratory: Negative for shortness of breath.   Cardiovascular: Negative for chest pain.  Gastrointestinal: Positive for nausea and abdominal pain.  Genitourinary: Positive for flank pain.  Neurological: Negative for headaches.    Allergies  Penicillins and Tramadol  Home Medications   Prior to Admission medications   Medication Sig Start Date End Date Taking? Authorizing Provider  amitriptyline (ELAVIL) 10 MG tablet Take 10 mg by mouth at bedtime.   Yes Historical Provider, MD  atorvastatin (LIPITOR) 80 MG tablet Take 80 mg by mouth daily.   Yes Historical Provider, MD  carbidopa-levodopa (SINEMET IR) 25-100 MG per tablet Take 1 tablet by mouth 3 (three) times daily.   Yes Historical Provider, MD  citalopram (CELEXA) 10 MG tablet Take 10 mg  by mouth daily.   Yes Historical Provider, MD  cyanocobalamin 1000 MCG tablet Take 100 mcg by mouth daily.   Yes Historical Provider, MD  glucosamine-chondroitin 500-400 MG tablet Take 1 tablet by mouth 3 (three) times daily.   Yes Historical Provider, MD  lisinopril (PRINIVIL,ZESTRIL) 5 MG tablet Take 5 mg by mouth daily.   Yes Historical Provider, MD  metoprolol succinate (TOPROL-XL) 25 MG 24 hr tablet Take 25 mg by mouth daily.   Yes Historical  Provider, MD  Multiple Vitamin (MULTIVITAMIN) tablet Take 1 tablet by mouth daily.   Yes Historical Provider, MD  omega-3 acid ethyl esters (LOVAZA) 1 G capsule Take by mouth 2 (two) times daily.   Yes Historical Provider, MD  pantoprazole (PROTONIX) 40 MG tablet Take 40 mg by mouth daily.   Yes Historical Provider, MD  rOPINIRole (REQUIP) 0.5 MG tablet Take 0.5 mg by mouth 3 (three) times daily.   Yes Historical Provider, MD  vitamin C (ASCORBIC ACID) 500 MG tablet Take 500 mg by mouth daily.   Yes Historical Provider, MD  furosemide (LASIX) 20 MG tablet Take 20 mg by mouth.    Historical Provider, MD  nitroGLYCERIN (NITROSTAT) 0.4 MG SL tablet Place 0.4 mg under the tongue every 5 (five) minutes as needed for chest pain.    Historical Provider, MD   Meds Ordered and Administered this Visit   Medications  ketorolac (TORADOL) injection 60 mg (60 mg Intramuscular Given 05/28/15 1104)    BP 160/85 mmHg  Pulse 58  Temp(Src) 97.5 F (36.4 C)  Resp 18  Ht 5\' 5"  (1.651 m)  Wt 160 lb (72.576 kg)  BMI 26.63 kg/m2  SpO2 99% No data found.   Physical Exam  Constitutional: He appears well-developed and well-nourished.  HENT:  Head: Normocephalic and atraumatic.  Eyes: Pupils are equal, round, and reactive to light.  Neck: Normal range of motion. Neck supple. No tracheal deviation present.  Cardiovascular: Normal rate and regular rhythm.   Pulmonary/Chest: Effort normal and breath sounds normal. He has no wheezes.  Abdominal: Soft. Bowel sounds are normal. He exhibits no shifting dullness and no distension. There is no hepatosplenomegaly, splenomegaly or hepatomegaly. There is tenderness. There is CVA tenderness. No hernia. Hernia confirmed negative in the ventral area.    Musculoskeletal: Normal range of motion. He exhibits tenderness.  Neurological: He is alert.  Patient has tremor of Parkinson disease  Skin: Skin is warm and dry.  Psychiatric: He has a normal mood and affect.  Nursing  note and vitals reviewed.   ED Course  Procedures (including critical care time)  Labs Review Labs Reviewed  CBC WITH DIFFERENTIAL/PLATELET - Abnormal; Notable for the following:    WBC 12.1 (*)    Neutro Abs 10.6 (*)    All other components within normal limits  COMPREHENSIVE METABOLIC PANEL - Abnormal; Notable for the following:    Glucose, Bld 164 (*)    Calcium 8.8 (*)    ALT 13 (*)    Total Bilirubin 1.3 (*)    All other components within normal limits  URINALYSIS COMPLETEWITH MICROSCOPIC ([facility] ONLY) - Abnormal; Notable for the following:    Color, Urine BROWN (*)    APPearance CLOUDY (*)    Specific Gravity, Urine >1.030 (*)    Hgb urine dipstick 3+ (*)    Protein, ur >300 (*)    Bacteria, UA MANY (*)    Squamous Epithelial / LPF 0-5 (*)    All other components within normal  limits  URINE CULTURE  LIPASE, BLOOD  AMYLASE    Imaging Review Ct Renal Stone Study  05/28/2015  CLINICAL DATA:  Acute left lower quadrant abdominal pain. Gross hematuria. EXAM: CT ABDOMEN AND PELVIS WITHOUT CONTRAST TECHNIQUE: Multidetector CT imaging of the abdomen and pelvis was performed following the standard protocol without IV contrast. COMPARISON:  None. FINDINGS: Visualized lung bases are unremarkable. No significant osseous abnormality is noted. Minimal cholelithiasis is noted. No focal abnormality is noted in the liver, spleen or pancreas on these unenhanced images. Adrenal glands are unremarkable. Right kidney and ureter appear normal. Moderate left hydroureteronephrosis with perinephric stranding is noted secondary to 2 adjacent calculi in the distal left ureter, with the largest measuring 4 mm. Atherosclerosis of abdominal aorta is noted without aneurysm formation. The appendix appears normal. There is no evidence of bowel obstruction. Urinary bladder is unremarkable. Mild prostatic enlargement is noted. No significant adenopathy is noted. 6.5 x 4.5 cm lipoma is noted extending to right  obturator foramen. IMPRESSION: Atherosclerosis of abdominal aorta without aneurysm formation. Mild prostatic enlargement. Minimal cholelithiasis. 6.5 x 0.5 cm probable lipoma seen in right obturator foramen. Moderate left hydroureteronephrosis is noted with perinephric stranding secondary to 2 adjacent calculi in the distal left ureter, largest measuring 4 mm. Electronically Signed   By: Marijo Conception, M.D.   On: 05/28/2015 13:54     Visual Acuity Review  Right Eye Distance:   Left Eye Distance:   Bilateral Distance:    Right Eye Near:   Left Eye Near:    Bilateral Near:    Results for orders placed or performed during the hospital encounter of 05/28/15  CBC with Differential  Result Value Ref Range   WBC 12.1 (H) 3.8 - 10.6 K/uL   RBC 5.00 4.40 - 5.90 MIL/uL   Hemoglobin 14.8 13.0 - 18.0 g/dL   HCT 43.8 40.0 - 52.0 %   MCV 87.5 80.0 - 100.0 fL   MCH 29.6 26.0 - 34.0 pg   MCHC 33.8 32.0 - 36.0 g/dL   RDW 13.5 11.5 - 14.5 %   Platelets 150 150 - 440 K/uL   Neutrophils Relative % 88 %   Neutro Abs 10.6 (H) 1.4 - 6.5 K/uL   Lymphocytes Relative 9 %   Lymphs Abs 1.1 1.0 - 3.6 K/uL   Monocytes Relative 3 %   Monocytes Absolute 0.4 0.2 - 1.0 K/uL   Eosinophils Relative 0 %   Eosinophils Absolute 0.0 0 - 0.7 K/uL   Basophils Relative 0 %   Basophils Absolute 0.0 0 - 0.1 K/uL  Comprehensive metabolic panel  Result Value Ref Range   Sodium 138 135 - 145 mmol/L   Potassium 3.6 3.5 - 5.1 mmol/L   Chloride 107 101 - 111 mmol/L   CO2 26 22 - 32 mmol/L   Glucose, Bld 164 (H) 65 - 99 mg/dL   BUN 16 6 - 20 mg/dL   Creatinine, Ser 1.13 0.61 - 1.24 mg/dL   Calcium 8.8 (L) 8.9 - 10.3 mg/dL   Total Protein 7.2 6.5 - 8.1 g/dL   Albumin 4.1 3.5 - 5.0 g/dL   AST 30 15 - 41 U/L   ALT 13 (L) 17 - 63 U/L   Alkaline Phosphatase 76 38 - 126 U/L   Total Bilirubin 1.3 (H) 0.3 - 1.2 mg/dL   GFR calc non Af Amer >60 >60 mL/min   GFR calc Af Amer >60 >60 mL/min   Anion gap 5 5 - 15  Lipase,  blood  Result Value Ref Range   Lipase 25 11 - 51 U/L  Amylase  Result Value Ref Range   Amylase 83 28 - 100 U/L  Urinalysis complete, with microscopic  Result Value Ref Range   Color, Urine BROWN (A) YELLOW   APPearance CLOUDY (A) CLEAR   Glucose, UA NEGATIVE NEGATIVE mg/dL   Bilirubin Urine NEGATIVE NEGATIVE   Ketones, ur NEGATIVE NEGATIVE mg/dL   Specific Gravity, Urine >1.030 (H) 1.005 - 1.030   Hgb urine dipstick 3+ (A) NEGATIVE   pH 6.0 5.0 - 8.0   Protein, ur >300 (A) NEGATIVE mg/dL   Nitrite NEGATIVE NEGATIVE   Leukocytes, UA NEGATIVE NEGATIVE   RBC / HPF TOO NUMEROUS TO COUNT 0 - 5 RBC/hpf   WBC, UA 0-5 0 - 5 WBC/hpf   Bacteria, UA MANY (A) NONE SEEN   Squamous Epithelial / LPF 0-5 (A) NONE SEEN   Amorphous Crystal PRESENT    Ca Oxalate Crys, UA PRESENT        MDM  No diagnosis found.   Patient was given 60 mg of Toradol. Will obtain lab work and plan for renal stone study today as well.  Patient with several abnormalities on CT scan   Discussed family he had consultation of the abdominal aorta gallstones but also 2 stones on the left side the biggest being 4 mm some hydronephrosis. His urine was also abnormal indicating a UTI as well. Urine culture is pending we'll place on Cipro 500. He states he's had bladder stones before required Percocet 10 mg we'll give him 20 of those tablets every 8 hours Zofran for nausea and Cipro for the UTI. If he is not improving or if he is not recover 2 stones by Monday please see his PCP may need to have repeat CT scan. Copy of CT scan report was also given to patient as well.  The injection of Toradol did seem to help with some of the pain and discomfort.   Note: This dictation was prepared with Dragon dictation along with smaller phrase technology. Any transcriptional errors that result from this process are unintentional.  Frederich Cha, MD 05/28/15 [phone]

## 2015-05-28 NOTE — ED Notes (Signed)
Patient does not need a prior authorization for a CT scan Per Medicare.

## 2015-05-30 ENCOUNTER — Telehealth: Payer: Self-pay | Admitting: *Deleted

## 2015-05-30 LAB — URINE CULTURE: SPECIAL REQUESTS: NORMAL

## 2015-05-30 NOTE — ED Notes (Signed)
Called patient and informed his wife that his urine culture came back negative for bacteria. Patient's wife reports that the patient is still in pain, but his is passing a fine grit into his urine strainer.

## 2015-06-04 ENCOUNTER — Ambulatory Visit: Payer: Self-pay

## 2015-06-09 ENCOUNTER — Encounter: Payer: Self-pay | Admitting: Urology

## 2015-06-09 ENCOUNTER — Ambulatory Visit (INDEPENDENT_AMBULATORY_CARE_PROVIDER_SITE_OTHER): Payer: Medicare Other | Admitting: Urology

## 2015-06-09 VITALS — BP 129/76 | HR 54 | Ht 65.0 in | Wt 162.0 lb

## 2015-06-09 DIAGNOSIS — Z87898 Personal history of other specified conditions: Secondary | ICD-10-CM

## 2015-06-09 DIAGNOSIS — N200 Calculus of kidney: Secondary | ICD-10-CM

## 2015-06-09 DIAGNOSIS — Z87448 Personal history of other diseases of urinary system: Secondary | ICD-10-CM | POA: Diagnosis not present

## 2015-06-09 DIAGNOSIS — N201 Calculus of ureter: Secondary | ICD-10-CM

## 2015-06-09 LAB — URINALYSIS, COMPLETE
Bilirubin, UA: NEGATIVE
Glucose, UA: NEGATIVE
Ketones, UA: NEGATIVE
LEUKOCYTES UA: NEGATIVE
NITRITE UA: NEGATIVE
PH UA: 5.5 (ref 5.0–7.5)
Protein, UA: NEGATIVE
RBC UA: NEGATIVE
Specific Gravity, UA: 1.02 (ref 1.005–1.030)
Urobilinogen, Ur: 0.2 mg/dL (ref 0.2–1.0)

## 2015-06-09 LAB — MICROSCOPIC EXAMINATION
BACTERIA UA: NONE SEEN
EPITHELIAL CELLS (NON RENAL): NONE SEEN /HPF (ref 0–10)

## 2015-06-09 MED ORDER — TAMSULOSIN HCL 0.4 MG PO CAPS: 0.4000 mg | ORAL_CAPSULE | Freq: Every day | ORAL | Status: DC

## 2015-06-09 NOTE — Progress Notes (Signed)
06/09/2015 1:06 PM   Christian Thompson [birth year] [id number]  Referring provider: [name], MD [place], [place] [id number]  Chief Complaint  Patient presents with  . Nephrolithiasis    HPI:  65 year old patient formerly of Dr. Ernst Thompson followed by Dr. Elnoria Thompson who presents today after being seen in the emergency room for an acute stone episode.  Left flank pain/ left ureteral stone History of nephrolithiasis Presented to the emergency room on 05/28/2015 with acute onset left flank pain, creatinine 1.13, WBC 12, grossly bloody urine without evidence of infection.  CT abdomen and pelvis showed 2 left distal ureteral stones measuring up to 4 mm each.  He was started on Cipro for presumed UTI although urine culture is negative.  Last episode of flank pain several days ago. Unsure if he is pasted the stone.  History of elevated PSA/ possible prostate cancer 07/24/2012  2.1 08/2012 prostate biopsy - small focus of glands suspicious for prostate cancer, right lateral base 07/24/2013  2.38 01/20/14  2.28  BPH/ LUTS/ history of bladder stones S/p via cystolitholapaxy by Dr. Ernst Thompson, reports this excised stone removed from bladder He does have some baseline urinary symptoms including hesitancy, occult he starting and stopping his stream, and weak stream.  Nocturia x 0-1.  No significant frequency, urgency.  No UTIs.   Not currently on any BPH meds  PMH significant for parkinson's, CAD   PMH: Past Medical History  Diagnosis Date  . Coronary artery disease   . Hypertension   . GERD (gastroesophageal reflux disease)   . Hyperlipidemia   . Parkinson disease ([name])   . RLS (restless legs syndrome)   . Renal disorder   . Arteriosclerosis of coronary artery 03/07/2011    Overview:  Non ST-elevation myocardial infarction.  Status post coronary artery bypass grafting x4 with left internal mammary artery to left anterior descending artery, saphenous vein graft to posterior descending  artery, obtuse marginal 2 and obtuse marginal 1 individually on 02/10/2011, by [facility].   . Acid reflux 03/07/2011  . Carpal tunnel syndrome 03/26/2014  . 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    [name] Parkinson's disease ([place]) 03/07/2011  . HLD (hyperlipidemia) 04/29/2011    Overview:  Formatting of this note may be different from the original. Component     Latest Ref Rng 02/10/2011 05/02/2011  Cholesterol, Total      171 114  Triglyceride      64 111  HDL Cholesterol      46 50  Low Density Lipoprotein      105 42    . Restless leg 03/21/2012    Surgical History: Past Surgical History  Procedure Laterality Date  . Hernia repair    . Cardiac catheterization    . Coronary artery bypass graft    . Lithotripsy    . Hemmorrhoidectomy    . Hand surgery    . Neuroplasty / transposition median nerve at carpal tunnel Left   . Colonoscopy with propofol N/A 09/29/2014    Procedure: COLONOSCOPY WITH PROPOFOL;  Surgeon: Christian Sails, MD;  Location: [facility] ENDOSCOPY;  Service: Endoscopy;  Laterality: N/A;  . Esophagogastroduodenoscopy (egd) with propofol N/A 09/29/2014    Procedure: ESOPHAGOGASTRODUODENOSCOPY (EGD) WITH PROPOFOL;  Surgeon: Christian Sails, MD;  Location: [facility] ENDOSCOPY;  Service: Endoscopy;  Laterality: N/A;    Home Medications:    Medication List       This list is  accurate as of: 06/09/15 11:59 PM.  Always use your most recent med list.               amitriptyline 10 MG tablet  Commonly known as:  ELAVIL  Take 10 mg by mouth at bedtime.     atorvastatin 80 MG tablet  Commonly known as:  LIPITOR  Take 80 mg by mouth daily.     carbidopa-levodopa 25-100 MG tablet  Commonly known as:  SINEMET IR  Take 1 tablet by mouth 3 (three) times daily.     citalopram 10 MG tablet  Commonly known as:  CELEXA  Take 10 mg by mouth daily.     cyanocobalamin 1000 MCG tablet  Take 100 mcg by mouth daily.     glucosamine-chondroitin 500-400  MG tablet  Take 1 tablet by mouth 3 (three) times daily.     lisinopril 5 MG tablet  Commonly known as:  PRINIVIL,ZESTRIL  Take 5 mg by mouth daily.     metoprolol succinate 25 MG 24 hr tablet  Commonly known as:  TOPROL-XL  Take 25 mg by mouth daily.     multivitamin tablet  Take 1 tablet by mouth daily.     nitroGLYCERIN 0.4 MG SL tablet  Commonly known as:  NITROSTAT  Place 0.4 mg under the tongue every 5 (five) minutes as needed for chest pain.     omega-3 acid ethyl esters 1 g capsule  Commonly known as:  LOVAZA  Take by mouth 2 (two) times daily.     ondansetron 8 MG disintegrating tablet  Commonly known as:  ZOFRAN ODT  Take 1 tablet (8 mg total) by mouth every 8 (eight) hours as needed for nausea or vomiting.     oxyCODONE-acetaminophen 10-325 MG tablet  Commonly known as:  PERCOCET  Take 1 tablet by mouth every 8 (eight) hours as needed for pain.     pantoprazole 40 MG tablet  Commonly known as:  PROTONIX  Take 40 mg by mouth daily.     rOPINIRole 0.5 MG tablet  Commonly known as:  REQUIP  Take 0.5 mg by mouth 3 (three) times daily.     tamsulosin 0.4 MG Caps capsule  Commonly known as:  FLOMAX  Take 1 capsule (0.4 mg total) by mouth daily.     vitamin C 500 MG tablet  Commonly known as:  ASCORBIC ACID  Take 500 mg by mouth daily.        Allergies:  Allergies  Allergen Reactions  . Penicillins   . Tramadol     Family History: Family History  Problem Relation Age of Onset  . Asthma Mother   . Alzheimer's disease Father   . Emphysema Mother     Social History:  reports that he has never smoked. He quit smokeless tobacco use about 8 years ago. He reports that he does not drink alcohol or use illicit drugs.  ROS: UROLOGY Frequent Urination?: No Hard to postpone urination?: No Burning/pain with urination?: No Get up at night to urinate?: No Leakage of urine?: No Urine stream starts and stops?: Yes Trouble starting stream?: No Do you have  to strain to urinate?: No Blood in urine?: No Urinary tract infection?: No Sexually transmitted disease?: No Injury to kidneys or bladder?: No Painful intercourse?: No Weak stream?: No Erection problems?: No Penile pain?: No  Gastrointestinal Nausea?: No Vomiting?: No Indigestion/heartburn?: Yes Diarrhea?: No Constipation?: No  Constitutional Fever: No Night sweats?: No Weight loss?: No Fatigue?: No  Skin Skin rash/lesions?: No Itching?: No  Eyes Blurred vision?: No Double vision?: No  Ears/Nose/Throat Sore throat?: No Sinus problems?: Yes  Hematologic/Lymphatic Swollen glands?: No Easy bruising?: No  Cardiovascular Leg swelling?: No Chest pain?: No  Respiratory Cough?: Yes Shortness of breath?: No  Endocrine Excessive thirst?: No  Musculoskeletal Back pain?: No Joint pain?: Yes  Neurological Headaches?: No Dizziness?: No  Psychologic Depression?: No Anxiety?: No  Physical Exam: BP 129/76 mmHg  Pulse 54  Ht 5\' 5"  (1.651 m)  Wt 162 lb (73.483 kg)  BMI 26.96 kg/m2  Constitutional:  Alert and oriented, No acute distress.  Presents with wife. HEENT: [place] AT, moist mucus membranes.  Trachea midline, no masses.  Masked face. Cardiovascular: No clubbing, cyanosis, or edema. Respiratory: Normal respiratory effort, no increased work of breathing. GI: Abdomen is soft, nontender, nondistended, no abdominal masses GU: No CVA tenderness.  Skin: No rashes, bruises or suspicious lesions. Neurologic: Grossly intact, no focal deficits, moving all 4 extremities.  Bilateral hand tremor noted. Psychiatric: Normal mood and affect.  Laboratory Data: Lab Results  Component Value Date   WBC 12.1* 05/28/2015   HGB 14.8 05/28/2015   HCT 43.8 05/28/2015   MCV 87.5 05/28/2015   PLT 150 05/28/2015    Lab Results  Component Value Date   CREATININE 1.13 05/28/2015    PSA trend as above  Urinalysis Results for orders placed or performed in visit on  06/09/15  Microscopic Examination  Result Value Ref Range   WBC, UA 0-5 0 -  5 /hpf   RBC, UA 0-2 0 -  2 /hpf   Epithelial Cells (non renal) None seen 0 - 10 /hpf   Bacteria, UA None seen None seen/Few  Urinalysis, Complete  Result Value Ref Range   Specific Gravity, UA 1.020 1.005 - 1.030   pH, UA 5.5 5.0 - 7.5   Color, UA Yellow Yellow   Appearance Ur Clear Clear   Leukocytes, UA Negative Negative   Protein, UA Negative Negative/Trace   Glucose, UA Negative Negative   Ketones, UA Negative Negative   RBC, UA Negative Negative   Bilirubin, UA Negative Negative   Urobilinogen, Ur 0.2 0.2 - 1.0 mg/dL   Nitrite, UA Negative Negative   Microscopic Examination See below:     Pertinent Imaging: Study Result     CLINICAL DATA: Acute left lower quadrant abdominal pain. Gross hematuria.  EXAM: CT ABDOMEN AND PELVIS WITHOUT CONTRAST  TECHNIQUE: Multidetector CT imaging of the abdomen and pelvis was performed following the standard protocol without IV contrast.  COMPARISON: None.  FINDINGS: Visualized lung bases are unremarkable. No significant osseous abnormality is noted.  Minimal cholelithiasis is noted. No focal abnormality is noted in the liver, spleen or pancreas on these unenhanced images. Adrenal glands are unremarkable. Right kidney and ureter appear normal. Moderate left hydroureteronephrosis with perinephric stranding is noted secondary to 2 adjacent calculi in the distal left ureter, with the largest measuring 4 mm. Atherosclerosis of abdominal aorta is noted without aneurysm formation. The appendix appears normal. There is no evidence of bowel obstruction. Urinary bladder is unremarkable. Mild prostatic enlargement is noted. No significant adenopathy is noted. 6.5 x 4.5 cm lipoma is noted extending to right obturator foramen.  IMPRESSION: Atherosclerosis of abdominal aorta without aneurysm formation.  Mild prostatic enlargement.  Minimal  cholelithiasis.  6.5 x 0.5 cm probable lipoma seen in right obturator foramen.  Moderate left hydroureteronephrosis is noted with perinephric stranding secondary to 2 adjacent calculi in the distal  left ureter, largest measuring 4 mm.   Electronically Signed  By: Marijo Conception, M.D.  On: 05/28/2015 13:54   CT scan personally reviewed today and with the patient  Assessment & Plan:   1. Nephrolithiasis Recent stone episode with 4 mm distal ureteral stone 2, hydronephrosis. No recent flank pain, UA today negative perceivably is passed stone. Warning precautions reviewed. Flomax x 2 weeks, asked patient to also pay attention to voiding symptoms while taking this medication Recommend follow-up renal ultrasound in 2 weeks to ensure resolution of hydronephrosis - Urinalysis, Complete  2. Right ureteral stone As above - US Renal; Future  3. History of elevated PSA History of previously elevated PSA and possible prostate cancer (suspicious glands) on previous biopsy PSA has remained stable Will follow-up PSA/ DRE next visit, we'll continue to follow conservatively given patient's medical comorbidities and PSA stability  4. History of bladder stone No recurrence   Return in about 2 weeks (around 06/23/2015) for PSA/ DRE/ f/u RUS.  Hollice Espy, MD  [facility] [address] [place], [place] [address] [phone]

## 2015-06-10 ENCOUNTER — Encounter: Payer: Self-pay | Admitting: Urology

## 2015-06-22 ENCOUNTER — Ambulatory Visit
Admission: RE | Admit: 2015-06-22 | Discharge: 2015-06-22 | Disposition: A | Discharge status: Home / Self Care | Payer: Medicare Other | Source: Ambulatory Visit | Attending: Urology | Admitting: Urology

## 2015-06-22 DIAGNOSIS — N201 Calculus of ureter: Secondary | ICD-10-CM | POA: Diagnosis present

## 2015-06-22 DIAGNOSIS — Z87442 Personal history of urinary calculi: Secondary | ICD-10-CM | POA: Insufficient documentation

## 2015-06-22 DIAGNOSIS — Z09 Encounter for follow-up examination after completed treatment for conditions other than malignant neoplasm: Secondary | ICD-10-CM | POA: Diagnosis not present

## 2015-06-22 DIAGNOSIS — N281 Cyst of kidney, acquired: Secondary | ICD-10-CM | POA: Insufficient documentation

## 2015-06-25 ENCOUNTER — Encounter: Payer: Self-pay | Admitting: *Deleted

## 2015-06-25 ENCOUNTER — Ambulatory Visit (INDEPENDENT_AMBULATORY_CARE_PROVIDER_SITE_OTHER): Payer: Medicare Other | Admitting: Urology

## 2015-06-25 VITALS — BP 123/78 | HR 48 | Ht 65.0 in | Wt 163.0 lb

## 2015-06-25 DIAGNOSIS — N401 Enlarged prostate with lower urinary tract symptoms: Secondary | ICD-10-CM | POA: Diagnosis not present

## 2015-06-25 DIAGNOSIS — N200 Calculus of kidney: Secondary | ICD-10-CM | POA: Diagnosis not present

## 2015-06-25 DIAGNOSIS — Z87898 Personal history of other specified conditions: Secondary | ICD-10-CM | POA: Diagnosis not present

## 2015-06-25 DIAGNOSIS — N138 Other obstructive and reflux uropathy: Secondary | ICD-10-CM

## 2015-06-25 NOTE — Progress Notes (Signed)
06/25/2015 11:49 AM   Christian Thompson 1950-04-22 [id number]  Referring provider: [name], MD [place], [place] [id number]  Chief Complaint  Patient presents with  . Elevated PSA    2wk    HPI: Patient is a 65 year old Caucasian male who presents today to discuss his renal ultrasound results and to follow-up on his history of elevated PSA and BPH with LUTS.  Left flank pain/ left ureteral stone History of nephrolithiasis.  Patient presented to the emergency room on 05/28/2015 with acute onset left flank pain, creatinine 1.13, WBC 12, grossly bloody urine without evidence of infection. CT abdomen and pelvis showed 2 left distal ureteral stones measuring up to 4 mm each. He was started on Cipro for presumed UTI although urine culture is negative. Last episode of flank pain several days ago. Unsure if he is pasted the stone.  RUS completed on 06/22/2015 noted resolution of the left-sided hydronephrosis and a right renal cyst. I reviewed the film personally with the patient and his wife.  History of elevated PSA/ possible prostate cancer 07/24/2012 2.1 08/2012 prostate biopsy - small focus of glands suspicious for prostate cancer, right lateral base 07/24/2013 2.38 01/20/14 2.28 PSA is drawn today.  BPH WITH LUTS His IPSS score today is 7, which is mild lower urinary tract symptomatology. He is mostly satisfied with his quality life due to his urinary symptoms.  He denies any dysuria, hematuria or suprapubic pain.  He currently taking tamsulosin 0.4 mg daily.  His has had cystolitholapaxy for bladder stones.  He also denies any recent fevers, chills, nausea or vomiting.  He does not have a family history of PCa.      IPSS      06/25/15 1100       International Prostate Symptom Score   How often have you had the sensation of not emptying your bladder? Less than half the time     How often have you had to urinate less than every two hours? Less than 1 in 5  times     How often have you found you stopped and started again several times when you urinated? Less than 1 in 5 times     How often have you found it difficult to postpone urination? Less than 1 in 5 times     How often have you had a weak urinary stream? Less than half the time     How often have you had to strain to start urination? Not at All     How many times did you typically get up at night to urinate? None     Total IPSS Score 7     Quality of Life due to urinary symptoms   If you were to spend the rest of your life with your urinary condition just the way it is now how would you feel about that? Mostly Satisfied        Score:  1-7 Mild 8-19 Moderate 20-35 Severe   PMH: Past Medical History  Diagnosis Date  . Coronary artery disease   . Hypertension   . GERD (gastroesophageal reflux disease)   . Hyperlipidemia   . Parkinson disease ([place])   . RLS (restless legs syndrome)   . Renal disorder   . Arteriosclerosis of coronary artery 03/07/2011    Overview:  Non ST-elevation myocardial infarction.  Status post coronary artery bypass grafting x4 with left internal mammary artery to left anterior descending artery, saphenous vein graft to  posterior descending artery, obtuse marginal 2 and obtuse marginal 1 individually on 02/10/2011, by [facility].   . Acid reflux 03/07/2011  . Carpal tunnel syndrome 03/26/2014  . 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    [name] Parkinson's disease ([place]) 03/07/2011  . HLD (hyperlipidemia) 04/29/2011    Overview:  Formatting of this note may be different from the original. Component     Latest Ref Rng 02/10/2011 05/02/2011  Cholesterol, Total      171 114  Triglyceride      64 111  HDL Cholesterol      46 50  Low Density Lipoprotein      105 42    . Restless leg 03/21/2012  . Lipids serum increased   . RLS (restless legs syndrome)   . Dystonia   . Bilateral carpal tunnel syndrome     Surgical History: Past  Surgical History  Procedure Laterality Date  . Hernia repair    . Cardiac catheterization    . Coronary artery bypass graft    . Lithotripsy    . Hemmorrhoidectomy    . Hand surgery    . Neuroplasty / transposition median nerve at carpal tunnel Left   . Colonoscopy with propofol N/A 09/29/2014    Procedure: COLONOSCOPY WITH PROPOFOL;  Surgeon: [name], MD;  Location[facility] ENDOSCOPY;  Service: Endoscopy;  Laterality: N/A;  . Esophagogastroduodenoscopy (egd) with propofol N/A 09/29/2014    Procedure: ESOPHAGOGASTRODUODENOSCOPY (EGD) WITH PROPOFOL;  Surgeon: [name], MD;  Location: [facility] ENDOSCOPY;  Service: Endoscopy;  Laterality: N/A;    Home Medications:    Medication List       This list is accurate as of: 06/25/15 11:49 AM.  Always use your most recent med list.               amitriptyline 10 MG tablet  Commonly known as:  ELAVIL  Take 10 mg by mouth at bedtime.     atorvastatin 80 MG tablet  Commonly known as:  LIPITOR  Take 80 mg by mouth daily.     carbidopa-levodopa 25-100 MG tablet  Commonly known as:  SINEMET IR  Take 1 tablet by mouth 3 (three) times daily.     citalopram 10 MG tablet  Commonly known as:  CELEXA  Take 10 mg by mouth daily.     cyanocobalamin 1000 MCG tablet  Take 100 mcg by mouth daily.     glucosamine-chondroitin 500-400 MG tablet  Take 1 tablet by mouth 3 (three) times daily.     lisinopril 5 MG tablet  Commonly known as:  PRINIVIL,ZESTRIL  Take 5 mg by mouth daily.     metoprolol succinate 25 MG 24 hr tablet  Commonly known as:  TOPROL-XL  Take 25 mg by mouth daily.     multivitamin tablet  Take 1 tablet by mouth daily.     nitroGLYCERIN 0.4 MG SL tablet  Commonly known as:  NITROSTAT  Place 0.4 mg under the tongue every 5 (five) minutes as needed for chest pain.     omega-3 acid ethyl esters 1 g capsule  Commonly known as:  LOVAZA  Take by mouth 2 (two) times daily.     pantoprazole 40 MG tablet  Commonly  known as:  PROTONIX  Take 40 mg by mouth daily.     rOPINIRole 0.5 MG tablet  Commonly known as:  REQUIP  Take 0.5 mg by mouth 3 (three)  times daily.     tamsulosin 0.4 MG Caps capsule  Commonly known as:  FLOMAX  Take 1 capsule (0.4 mg total) by mouth daily.     vitamin C 500 MG tablet  Commonly known as:  ASCORBIC ACID  Take 500 mg by mouth daily.        Allergies:  Allergies  Allergen Reactions  . Penicillins   . Tramadol     Family History: Family History  Problem Relation Age of Onset  . Asthma Mother   . Alzheimer's disease Father   . Emphysema Mother     Social History:  reports that he has never smoked. He quit smokeless tobacco use about 8 years ago. He reports that he does not drink alcohol or use illicit drugs.  ROS: UROLOGY Frequent Urination?: No Hard to postpone urination?: No Burning/pain with urination?: No Get up at night to urinate?: No Leakage of urine?: No Urine stream starts and stops?: No Trouble starting stream?: No Do you have to strain to urinate?: No Blood in urine?: No Urinary tract infection?: No Sexually transmitted disease?: No Injury to kidneys or bladder?: No Painful intercourse?: No Weak stream?: No Erection problems?: No Penile pain?: No  Gastrointestinal Nausea?: No Vomiting?: No Indigestion/heartburn?: Yes Diarrhea?: No Constipation?: No  Constitutional Fever: No Night sweats?: No Weight loss?: No Fatigue?: No  Skin Skin rash/lesions?: No Itching?: No  Eyes Blurred vision?: No Double vision?: No  Ears/Nose/Throat Sore throat?: No Sinus problems?: No  Hematologic/Lymphatic Swollen glands?: No Easy bruising?: No  Cardiovascular Leg swelling?: No Chest pain?: No  Respiratory Cough?: Yes Shortness of breath?: No  Endocrine Excessive thirst?: No  Musculoskeletal Back pain?: No Joint pain?: No  Neurological Headaches?: No Dizziness?: No  Psychologic Depression?: No Anxiety?:  No  Physical Exam: BP 123/78 mmHg  Pulse 48  Ht 5\' 5"  (1.651 m)  Wt 163 lb (73.936 kg)  BMI 27.12 kg/m2  Constitutional: Well nourished. Alert and oriented, No acute distress. HEENT: [place] AT, moist mucus membranes. Trachea midline, no masses. Cardiovascular: No clubbing, cyanosis, or edema. Respiratory: Normal respiratory effort, no increased work of breathing. GI: Abdomen is soft, non tender, non distended, no abdominal masses. Liver and spleen not palpable.  No hernias appreciated.  Stool sample for occult testing is not indicated.   GU: No CVA tenderness.  No bladder fullness or masses.  Patient with uncircumcised phallus.  Foreskin could not be easily retracted. Urethral meatus is patent.  No penile discharge. No penile lesions or rashes. Scrotum without lesions, cysts, rashes and/or edema.  Testicles are located scrotally bilaterally. No masses are appreciated in the testicles. Left and right epididymis are normal. Rectal: Patient with  normal sphincter tone. Anus and perineum without scarring or rashes. No rectal masses are appreciated. Prostate is approximately 70 grams, no nodules are appreciated. Seminal vesicles are normal. Skin: No rashes, bruises or suspicious lesions. Lymph: No cervical or inguinal adenopathy. Neurologic: Grossly intact, no focal deficits, moving all 4 extremities. Psychiatric: Normal mood and affect.  Laboratory Data: Lab Results  Component Value Date   WBC 12.1* 05/28/2015   HGB 14.8 05/28/2015   HCT 43.8 05/28/2015   MCV 87.5 05/28/2015   PLT 150 05/28/2015    Lab Results  Component Value Date   CREATININE 1.13 05/28/2015   PSA History  2.1 ng/mL on 07/24/2012  Prostate biopsy in July 2014 with a small focus of glands suspicious for prostate cancer, right lateral base  2.3 ng/mL on 07/24/2013  2.28 ng/mL  on 01/20/2014   Lab Results  Component Value Date   AST 30 05/28/2015   Lab Results  Component Value Date   ALT 13* 05/28/2015     Pertinent Imaging: CLINICAL DATA: Right ureteral calculus.  EXAM: RENAL / URINARY TRACT ULTRASOUND COMPLETE  COMPARISON: 05/28/2015  FINDINGS: Right Kidney:  Length: 11.4 cm. Cyst arising from the inferior pole measures 1.6 cm. Echogenicity within normal limits. No mass or hydronephrosis visualized.  Left Kidney:  Length: 12.5 cm. Echogenicity within normal limits. No mass or hydronephrosis visualized.  Bladder:  Appears normal for degree of bladder distention. Enlarged prostate gland has mass effect upon the bladder base.  IMPRESSION: 1. Resolution of left-sided hydronephrosis. 2. Right renal cyst   Electronically Signed  By: Kerby Moors M.D.  On: 06/22/2015 14:38  Assessment & Plan:    1. History of elevated PSA:  History of previously elevated PSA and possible prostate cancer (suspicious glands) on previous biopsy PSA has remained stable.  PSA is drawn today.   RTC in one year for exam and PSA.    - PSA  2. Nephrolithiasis:   Recent stone episode with 4 mm distal ureteral stone 2, hydronephrosis. No recent flank pain.  RUS was negative.  RTC in one year for KUB and UA.  He will contact the office if he should develop hematuria or flank pain.   3. BPH with LUTS:   IPSS score is 7/2.    He will continue the tamsulosin 0.4 mg daily.  He will have his IPSS score, exam and PSA in 12 months.    Return in about 1 year (around 06/24/2016) for IPSS, UA, KUB and exam.  These notes generated with voice recognition software. I apologize for typographical errors.  Zara Council, [facility] [address], [place], [place] [address] [phone]

## 2015-06-26 ENCOUNTER — Ambulatory Visit: Payer: Medicare Other | Admitting: Anesthesiology

## 2015-06-26 ENCOUNTER — Ambulatory Visit
Admission: RE | Admit: 2015-06-26 | Discharge: 2015-06-26 | Disposition: A | Discharge status: Home / Self Care | Payer: Medicare Other | Source: Ambulatory Visit | Attending: Gastroenterology | Admitting: Gastroenterology

## 2015-06-26 ENCOUNTER — Encounter
Admission: RE | Disposition: A | Discharge status: Home / Self Care | Payer: Self-pay | Source: Ambulatory Visit | Attending: Gastroenterology

## 2015-06-26 ENCOUNTER — Telehealth: Payer: Self-pay

## 2015-06-26 DIAGNOSIS — I10 Essential (primary) hypertension: Secondary | ICD-10-CM | POA: Diagnosis not present

## 2015-06-26 DIAGNOSIS — Z951 Presence of aortocoronary bypass graft: Secondary | ICD-10-CM | POA: Insufficient documentation

## 2015-06-26 DIAGNOSIS — Z8041 Family history of malignant neoplasm of ovary: Secondary | ICD-10-CM | POA: Insufficient documentation

## 2015-06-26 DIAGNOSIS — K449 Diaphragmatic hernia without obstruction or gangrene: Secondary | ICD-10-CM | POA: Insufficient documentation

## 2015-06-26 DIAGNOSIS — K224 Dyskinesia of esophagus: Secondary | ICD-10-CM | POA: Diagnosis not present

## 2015-06-26 DIAGNOSIS — K2960 Other gastritis without bleeding: Secondary | ICD-10-CM | POA: Insufficient documentation

## 2015-06-26 DIAGNOSIS — Z79899 Other long term (current) drug therapy: Secondary | ICD-10-CM | POA: Insufficient documentation

## 2015-06-26 DIAGNOSIS — K222 Esophageal obstruction: Secondary | ICD-10-CM | POA: Diagnosis not present

## 2015-06-26 DIAGNOSIS — I252 Old myocardial infarction: Secondary | ICD-10-CM | POA: Insufficient documentation

## 2015-06-26 DIAGNOSIS — Z888 Allergy status to other drugs, medicaments and biological substances status: Secondary | ICD-10-CM | POA: Diagnosis not present

## 2015-06-26 DIAGNOSIS — Z880 Allergy status to penicillin: Secondary | ICD-10-CM | POA: Insufficient documentation

## 2015-06-26 DIAGNOSIS — I2510 Atherosclerotic heart disease of native coronary artery without angina pectoris: Secondary | ICD-10-CM | POA: Diagnosis not present

## 2015-06-26 DIAGNOSIS — G5600 Carpal tunnel syndrome, unspecified upper limb: Secondary | ICD-10-CM | POA: Diagnosis not present

## 2015-06-26 DIAGNOSIS — G20 Parkinson's disease: Secondary | ICD-10-CM | POA: Insufficient documentation

## 2015-06-26 DIAGNOSIS — G248 Other dystonia: Secondary | ICD-10-CM | POA: Diagnosis not present

## 2015-06-26 DIAGNOSIS — Z87891 Personal history of nicotine dependence: Secondary | ICD-10-CM | POA: Insufficient documentation

## 2015-06-26 DIAGNOSIS — R1310 Dysphagia, unspecified: Secondary | ICD-10-CM | POA: Diagnosis present

## 2015-06-26 DIAGNOSIS — Z825 Family history of asthma and other chronic lower respiratory diseases: Secondary | ICD-10-CM | POA: Diagnosis not present

## 2015-06-26 DIAGNOSIS — G2581 Restless legs syndrome: Secondary | ICD-10-CM | POA: Diagnosis not present

## 2015-06-26 DIAGNOSIS — E785 Hyperlipidemia, unspecified: Secondary | ICD-10-CM | POA: Diagnosis not present

## 2015-06-26 DIAGNOSIS — Z8249 Family history of ischemic heart disease and other diseases of the circulatory system: Secondary | ICD-10-CM | POA: Diagnosis not present

## 2015-06-26 DIAGNOSIS — G5603 Carpal tunnel syndrome, bilateral upper limbs: Secondary | ICD-10-CM | POA: Diagnosis not present

## 2015-06-26 HISTORY — DX: Ulcer of esophagus without bleeding: K22.10

## 2015-06-26 HISTORY — DX: Diverticulosis of intestine, part unspecified, without perforation or abscess without bleeding: K57.90

## 2015-06-26 HISTORY — DX: Dystonia, unspecified: G24.9

## 2015-06-26 HISTORY — PX: ESOPHAGOGASTRODUODENOSCOPY (EGD) WITH PROPOFOL: SHX5813

## 2015-06-26 HISTORY — DX: Carpal tunnel syndrome, bilateral upper limbs: G56.03

## 2015-06-26 HISTORY — DX: Hyperlipidemia, unspecified: E78.5

## 2015-06-26 HISTORY — DX: Personal history of other diseases of the digestive system: Z87.19

## 2015-06-26 LAB — PSA: Prostate Specific Ag, Serum: 2.9 ng/mL (ref 0.0–4.0)

## 2015-06-26 SURGERY — ESOPHAGOGASTRODUODENOSCOPY (EGD) WITH PROPOFOL
Anesthesia: General

## 2015-06-26 MED ORDER — SODIUM CHLORIDE 0.9 % IV SOLN: INTRAVENOUS | Status: DC

## 2015-06-26 MED ORDER — EPHEDRINE SULFATE 50 MG/ML IJ SOLN
INTRAMUSCULAR | Status: DC | PRN
  Administered 2015-06-26 (×3): 5 mg via INTRAVENOUS

## 2015-06-26 MED ORDER — SODIUM CHLORIDE 0.9 % IV SOLN
INTRAVENOUS | Status: DC
  Administered 2015-06-26: 1000 mL via INTRAVENOUS

## 2015-06-26 MED ORDER — PROPOFOL 10 MG/ML IV BOLUS
INTRAVENOUS | Status: DC | PRN
  Administered 2015-06-26: 20 mg via INTRAVENOUS
  Administered 2015-06-26: 10 mg via INTRAVENOUS
  Administered 2015-06-26: 20 mg via INTRAVENOUS

## 2015-06-26 MED ORDER — PROPOFOL 500 MG/50ML IV EMUL
INTRAVENOUS | Status: DC | PRN
  Administered 2015-06-26: 120 ug/kg/min via INTRAVENOUS

## 2015-06-26 MED ORDER — MIDAZOLAM HCL 2 MG/2ML IJ SOLN
INTRAMUSCULAR | Status: DC | PRN
  Administered 2015-06-26: 1 mg via INTRAVENOUS

## 2015-06-26 NOTE — Telephone Encounter (Signed)
-----   Message from Nori Riis, PA-C sent at 06/26/2015  8:19 AM EDT ----- PSA is stable.   We will see him in one year.

## 2015-06-26 NOTE — Anesthesia Preprocedure Evaluation (Addendum)
Anesthesia Evaluation  Patient identified by MRN, date of birth, ID band Patient awake    Reviewed: Allergy & Precautions, H&P , NPO status , Patient's Chart, lab work & pertinent test results  History of Anesthesia Complications Negative for: history of anesthetic complications  Airway Mallampati: III  TM Distance: >3 FB Neck ROM: limited    Dental  (+) Poor Dentition, Chipped, Partial Upper   Pulmonary shortness of breath and with exertion,    Pulmonary exam normal breath sounds clear to auscultation       Cardiovascular Exercise Tolerance: Poor hypertension, (-) angina+ CAD and + CABG  Normal cardiovascular exam Rhythm:regular Rate:Normal     Neuro/Psych  Neuromuscular disease negative psych ROS   GI/Hepatic Neg liver ROS, hiatal hernia, PUD, GERD  Controlled and Medicated,  Endo/Other  negative endocrine ROS  Renal/GU Renal disease  negative genitourinary   Musculoskeletal   Abdominal   Peds  Hematology negative hematology ROS (+)   Anesthesia Other Findings Past Medical History:   Coronary artery disease                                      Hypertension                                                 GERD (gastroesophageal reflux disease)                       Hyperlipidemia                                               Parkinson disease ([facility])                                      RLS (restless legs syndrome)                                 Renal disorder                                               Arteriosclerosis of coronary artery             03/07/2011      Comment:Overview:  Non ST-elevation myocardial               infarction.  Status post coronary artery bypass              grafting x4 with left internal mammary artery               to left anterior descending artery, saphenous               vein graft to posterior descending artery,               obtuse marginal 2 and obtuse marginal 1      individually on 02/10/2011, by Warnell Bureau.  [facility].    Acid reflux                                     03/07/2011    Carpal tunnel syndrome                          03/26/2014     Focal dystonia                                  03/12/2014      Comment:Overview:  Secondary to PD.     Parkinson's disease ([place])                       03/07/2011    HLD (hyperlipidemia)                            04/29/2011       Comment:Overview:  Formatting of this note may be               different from the original. Component                   Latest Ref Rng 02/10/2011 05/02/2011                Cholesterol, Total      171 114  Triglyceride                64 111  HDL Cholesterol      46 50  Low Density              Lipoprotein      105 42     Restless leg                                    03/21/2012    Lipids serum increased                                       RLS (restless legs syndrome)                                 Dystonia                                                     Bilateral carpal tunnel syndrome                             Diverticulosis                                               Erosive esophagitis  History of hiatal hernia                                    Past Surgical History:   HERNIA REPAIR                                                 CARDIAC CATHETERIZATION                                       CORONARY ARTERY BYPASS GRAFT                                  LITHOTRIPSY                                                   hemmorrhoidectomy                                             HAND SURGERY                                                  NEUROPLASTY / TRANSPOSITION MEDIAN NERVE AT CA* Left              COLONOSCOPY WITH PROPOFOL                       N/A 09/29/2014       Comment:Procedure: COLONOSCOPY WITH PROPOFOL;  Surgeon:              [name], MD;  Location: [facility]               ENDOSCOPY;   Service: Endoscopy;  Laterality:               N/A;   ESOPHAGOGASTRODUODENOSCOPY (EGD) WITH PROPOFOL  N/A 09/29/2014       Comment:Procedure: ESOPHAGOGASTRODUODENOSCOPY (EGD)               WITH PROPOFOL;  Surgeon: [name], MD;              Location: [facility] ENDOSCOPY;  Service: Endoscopy;               Laterality: N/A;     Reproductive/Obstetrics negative OB ROS                            Anesthesia Physical Anesthesia Plan  ASA: III  Anesthesia Plan: General   Post-op Pain Management:    Induction:   Airway Management Planned:   Additional Equipment:   Intra-op Plan:   Post-operative Plan:   Informed Consent: I have reviewed the patients History and Physical, chart, labs and discussed the procedure including  the risks, benefits and alternatives for the proposed anesthesia with the patient or authorized representative who has indicated his/her understanding and acceptance.   Dental Advisory Given  Plan Discussed with: Anesthesiologist, CRNA and Surgeon  Anesthesia Plan Comments:         Anesthesia Quick Evaluation

## 2015-06-26 NOTE — Telephone Encounter (Signed)
Mailbox full

## 2015-06-26 NOTE — Op Note (Signed)
[facility]enterology Patient Name: Christian Thompson Procedure Date: 06/26/2015 9:54 AM MRN: [id number] Account #: [id number] Date of Birth: 02-01-1951 Admit Type: Outpatient Age: 65 Room: [facility] ENDO ROOM 4 Gender: Male Note Status: Finalized Procedure:            Upper GI endoscopy Indications:          Dysphagia Providers:            [name], MD Referring MD:         Shirline Frees (Referring MD) Medicines:            Monitored Anesthesia Care Complications:        No immediate complications. Procedure:            Pre-Anesthesia Assessment:                       - ASA Grade Assessment: III - A patient with severe                        systemic disease.                       After obtaining informed consent, the endoscope was                        passed under direct vision. Throughout the procedure,                        the patient's blood pressure, pulse, and oxygen                        saturations were monitored continuously. The Endoscope                        was introduced through the mouth, and advanced to the                        third part of duodenum. The upper GI endoscopy was                        performed with moderate difficulty due to unusual                        anatomy. The patient tolerated the procedure well. Findings:      Abnormal motility was noted in the lower third of the esophagus. The       cricopharyngeus was normal. There is spasticity of the esophageal body.       The distal esophagus/lower esophageal sphincter is spastic, but gives up       passage to the endoscope. Tertiary peristaltic waves are noted.      The exam of the esophagus was otherwise normal.      Patchy mild inflammation characterized by erosions and erythema was       found in the gastric body and in the gastric antrum. Biopsies were taken       with a cold forceps for histology and Helicobacter pylori testing.      A small hiatal  hernia was present.      A low-grade of narrowing Schatzki ring (acquired) was found at the       gastroesophageal junction. A TTS dilator  was passed through the scope.       Dilation with a 15-16.5-18 mm balloon dilator was performed to 18 mm.       There is alot of esophageal contraction in the distal third of the       esop;lhagus, with the ring being likely intermittant, and stretching       "out of the way" on attempted dilation.      The examined duodenum was normal. Impression:           - Abnormal esophageal motility, suspicious for                        achalasia.                       - Erosive gastritis. Biopsied.                       - Small hiatal hernia.                       - Low-grade of narrowing Schatzki ring.                       - Normal examined duodenum. Recommendation:       - Discharge patient to home.                       - Discharge patient to home.                       - Perform routine esophageal manometry at appointment                        to be scheduled.                       - Perform a modified barium swallow at appointment to                        be scheduled.                       - Return to GI clinic in 6 weeks. Procedure Code(s):    --- Professional ---                       [phone], Esophagogastroduodenoscopy, flexible, transoral;                        with transendoscopic balloon dilation of esophagus                        (less than 30 mm diameter)                       43239, Esophagogastroduodenoscopy, flexible, transoral;                        with biopsy, single or multiple Diagnosis Code(s):    --- Professional ---                       K22.4, Dyskinesia of esophagus  K29.60, Other gastritis without bleeding                       K44.9, Diaphragmatic hernia without obstruction or                        gangrene                       K22.2, Esophageal obstruction                       R13.10, Dysphagia,  unspecified CPT copyright 2016 American Medical Association. All rights reserved. The codes documented in this report are preliminary and upon coder review may  be revised to meet current compliance requirements. [name], MD 06/26/2015 10:43:59 AM This report has been signed electronically. Number of Addenda: 0 Note Initiated On: 06/26/2015 9:54 AM      [facility]

## 2015-06-26 NOTE — H&P (Signed)
Outpatient short stay form Pre-procedure 06/26/2015 9:50 AM Christian Sails MD  Primary Physician: Dr [name]  Reason for visit:  EGD  History of present illness:  Patient is a 65 year old male presenting today with a history of abnormal barium swallow, dysphagia, reflux. His barium swallow shows what appears to be a narrowing at the bottom of the esophagus or this is read is likely related to esophagitis rather than actual stricture. View of this film also indicates some esophageal dysmotility and possible presbyesophagus. Patient does have a personal history of Parkinson's diagnosed about 8 years ago.    Current facility-administered medications:  .  0.9 %  sodium chloride infusion, , Intravenous, Continuous, Christian Sails, MD, Last Rate: 20 mL/hr at 06/26/15 0944, 1,000 mL at 06/26/15 0944 .  0.9 %  sodium chloride infusion, , Intravenous, Continuous, Christian Sails, MD  Prescriptions prior to admission  Medication Sig Dispense Refill Last Dose  . atorvastatin (LIPITOR) 80 MG tablet Take 80 mg by mouth daily.   06/26/2015 at 0600  . carbidopa-levodopa (SINEMET IR) 25-100 MG per tablet Take 1 tablet by mouth 3 (three) times daily.   06/26/2015 at 0600  . glucosamine-chondroitin 500-400 MG tablet Take 1 tablet by mouth 3 (three) times daily.   06/26/2015 at 0600  . lisinopril (PRINIVIL,ZESTRIL) 5 MG tablet Take 5 mg by mouth daily.   06/26/2015 at 0600  . metoprolol succinate (TOPROL-XL) 25 MG 24 hr tablet Take 25 mg by mouth daily.   06/26/2015 at 0600  . triamcinolone (NASACORT AQ) 55 MCG/ACT AERO nasal inhaler Place 2 sprays into the nose daily.     [name] amitriptyline (ELAVIL) 10 MG tablet Take 10 mg by mouth at bedtime.   Taking  . citalopram (CELEXA) 10 MG tablet Take 10 mg by mouth daily.   Taking  . cyanocobalamin 1000 MCG tablet Take 100 mcg by mouth daily.   Taking  . Multiple Vitamin (MULTIVITAMIN) tablet Take 1 tablet by mouth daily.   Taking  . nitroGLYCERIN (NITROSTAT) 0.4  MG SL tablet Place 0.4 mg under the tongue every 5 (five) minutes as needed for chest pain.   Taking  . omega-3 acid ethyl esters (LOVAZA) 1 G capsule Take by mouth 2 (two) times daily.   Taking  . pantoprazole (PROTONIX) 40 MG tablet Take 40 mg by mouth daily.   Taking  . rOPINIRole (REQUIP) 0.5 MG tablet Take 0.5 mg by mouth 3 (three) times daily.   Taking  . tamsulosin (FLOMAX) 0.4 MG CAPS capsule Take 1 capsule (0.4 mg total) by mouth daily. 30 capsule 0 Taking  . vitamin C (ASCORBIC ACID) 500 MG tablet Take 500 mg by mouth daily.   Taking     Allergies  Allergen Reactions  . Penicillins   . Tramadol      Past Medical History  Diagnosis Date  . Coronary artery disease   . Hypertension   . GERD (gastroesophageal reflux disease)   . Hyperlipidemia   . Parkinson disease ([place])   . RLS (restless legs syndrome)   . Renal disorder   . Arteriosclerosis of coronary artery 03/07/2011    Overview:  Non ST-elevation myocardial infarction.  Status post coronary artery bypass grafting x4 with left internal mammary artery to left anterior descending artery, saphenous vein graft to posterior descending artery, obtuse marginal 2 and obtuse marginal 1 individually on 02/10/2011, by [facility].   . Acid reflux 03/07/2011  . Carpal tunnel syndrome 03/26/2014  .  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    [name] Parkinson's disease ([place]) 03/07/2011  . HLD (hyperlipidemia) 04/29/2011    Overview:  Formatting of this note may be different from the original. Component     Latest Ref Rng 02/10/2011 05/02/2011  Cholesterol, Total      171 114  Triglyceride      64 111  HDL Cholesterol      46 50  Low Density Lipoprotein      105 42    . Restless leg 03/21/2012  . Lipids serum increased   . RLS (restless legs syndrome)   . Dystonia   . Bilateral carpal tunnel syndrome   . Diverticulosis   . Erosive esophagitis   . History of hiatal hernia     Review of systems:       Physical Exam    Heart and lungs: Regular rate and rhythm without rub or gallop, lungs are bilaterally clear.    HEENT: Normocephalic atraumatic eyes are anicteric    Other:     Pertinant exam for procedure: Soft nontender nondistended bowel sounds positive normoactive.    Planned proceedures: EGD and indicated procedures. I have discussed the risks benefits and complications of procedures to include not limited to bleeding, infection, perforation and the risk of sedation and the patient wishes to proceed.    Christian Sails, MD Gastroenterology 06/26/2015  9:50 AM

## 2015-06-26 NOTE — Anesthesia Procedure Notes (Signed)
Date/Time: 06/26/2015 9:59 AM Performed by: Johnna Acosta Pre-anesthesia Checklist: Patient identified, Emergency Drugs available, Suction available, Patient being monitored and Timeout performed Patient Re-evaluated:Patient Re-evaluated prior to inductionOxygen Delivery Method: Simple face mask

## 2015-06-26 NOTE — Transfer of Care (Signed)
Immediate Anesthesia Transfer of Care Note  Patient: Christian Thompson  Procedure(s) Performed: Procedure(s): ESOPHAGOGASTRODUODENOSCOPY (EGD) WITH PROPOFOL (N/A)  Patient Location: PACU  Anesthesia Type:General  Level of Consciousness: sedated  Airway & Oxygen Therapy: Patient Spontanous Breathing and Patient connected to face mask oxygen  Post-op Assessment: Report given to RN and Post -op Vital signs reviewed and stable  Post vital signs: Reviewed and stable  Last Vitals:  Filed Vitals:   06/26/15 0937  BP: 141/70  Pulse: 46  Temp: 36.3 C  Resp: 19    Last Pain: There were no vitals filed for this visit.       Complications: No apparent anesthesia complications

## 2015-06-26 NOTE — Anesthesia Postprocedure Evaluation (Signed)
Anesthesia Post Note  Patient: Jaquavian A Damiano  Procedure(s) Performed: Procedure(s) (LRB): ESOPHAGOGASTRODUODENOSCOPY (EGD) WITH PROPOFOL (N/A)  Patient location during evaluation: Endoscopy Anesthesia Type: General Level of consciousness: awake and alert Pain management: pain level controlled Vital Signs Assessment: post-procedure vital signs reviewed and stable Respiratory status: spontaneous breathing, nonlabored ventilation, respiratory function stable and patient connected to nasal cannula oxygen Cardiovascular status: blood pressure returned to baseline and stable Postop Assessment: no signs of nausea or vomiting Anesthetic complications: no    Last Vitals:  Filed Vitals:   06/26/15 1040 06/26/15 1110  BP: 88/30 102/52  Pulse: 59   Temp:    Resp: 17     Last Pain: There were no vitals filed for this visit.               Precious Haws Piscitello

## 2015-06-28 ENCOUNTER — Encounter: Payer: Self-pay | Admitting: Gastroenterology

## 2015-06-28 DIAGNOSIS — Z87898 Personal history of other specified conditions: Secondary | ICD-10-CM | POA: Insufficient documentation

## 2015-06-28 DIAGNOSIS — N138 Other obstructive and reflux uropathy: Secondary | ICD-10-CM | POA: Insufficient documentation

## 2015-06-28 DIAGNOSIS — N401 Enlarged prostate with lower urinary tract symptoms: Principal | ICD-10-CM | POA: Insufficient documentation

## 2015-06-28 DIAGNOSIS — N200 Calculus of kidney: Secondary | ICD-10-CM | POA: Insufficient documentation

## 2015-06-29 LAB — SURGICAL PATHOLOGY

## 2015-06-29 NOTE — Telephone Encounter (Signed)
Spoke with pt wife and made aware of PSA results. Wife voiced understanding.

## 2015-06-30 ENCOUNTER — Other Ambulatory Visit: Payer: Self-pay | Admitting: Gastroenterology

## 2015-06-30 DIAGNOSIS — R1310 Dysphagia, unspecified: Secondary | ICD-10-CM

## 2015-07-17 ENCOUNTER — Ambulatory Visit
Admission: RE | Admit: 2015-07-17 | Discharge: 2015-07-17 | Disposition: A | Discharge status: Home / Self Care | Payer: Medicare Other | Source: Ambulatory Visit | Attending: Gastroenterology | Admitting: Gastroenterology

## 2015-07-17 DIAGNOSIS — R1310 Dysphagia, unspecified: Secondary | ICD-10-CM | POA: Insufficient documentation

## 2015-07-17 NOTE — Therapy (Signed)
[facility], [place], [address] Phone: [phone]   Fax:     Modified Barium Swallow  Patient Details  Name: Christian Thompson MRN: [id number] Date of Birth: 11/04/1950 No Data Recorded  Encounter Date: 07/17/2015   Subjective: Patient behavior: (alertness, ability to follow instructions, etc.): Chief complaint: dysphagia; cough   Objective:  Radiological Procedure: A videoflouroscopic evaluation of oral-preparatory, reflex initiation, and pharyngeal phases of the swallow was performed; as well as a screening of the upper esophageal phase.  I. POSTURE: upright II. VIEW: lateral III. COMPENSATORY STRATEGIES: NONE IV. BOLUSES ADMINISTERED:  Thin Liquid: 3 trials  Nectar-thick Liquid: 1 trial  Honey-thick Liquid: NT  Puree: 2 trials  Mechanical Soft: 1 trial V. RESULTS OF EVALUATION: A. ORAL PREPARATORY PHASE: (The lips, tongue, and velum are observed for strength and coordination)       **Overall Severity Rating: WFL; though pt was missing most dentition during this study. Pt stated he does have an upper partial he wears sometimes. No lower dentition.   B. SWALLOW INITIATION/REFLEX: (The reflex is normal if "triggered" by the time the bolus reached the base of the tongue)  **Overall Severity Rating: [facility].  C. PHARYNGEAL PHASE: (Pharyngeal function is normal if the bolus shows rapid, smooth, and continuous transit through the pharynx and there is no pharyngeal residue after the swallow)  **Overall Severity Rating: [facility].   D. LARYNGEAL PENETRATION: (Material entering into the laryngeal inlet/vestibule but not aspirated): NONE E. ASPIRATION: NONE F. ESOPHAGEAL PHASE: (Screening of the upper esophagus): No deficits viewable in cervical Esophageal area. However, pt does have baseline of GERD and is on PPI. This may be related to pt's c/o "cough".   ASSESSMENT: Pt presented w/ a functional oropharyngeal  swallow w/ no apparent oropharyngeal phase deficits noted.   PLAN/RECOMMENDATIONS:  A. Diet: mech soft/regular w/ thin liquids - d/t pt's lacking of dentition; moisten foods well  B. Swallowing Precautions: general aspiration precautions; general Reflux precautions  C. Recommended consultation to: continue to f/u w/ GI for management and tx of GERD - discussion of PPI coverage and symptoms of Reflux. Also recommended f/u w/ Neurology re: timing of taking medication for Parkinson's   Disease in order to discuss potential benefit of taking the medication ~30-45 minutes PRIOR to meals in order for the medication to be in the system for the demand of swallowing during meals.   D. Therapy recommendations: None at this time  E. Results and recommendations were discussed w/ patient; video viewed w/ patient      End of Session - 07/17/15 1525    Visit Number 1   Number of Visits 1   Date for SLP Re-Evaluation 07/17/15   SLP Start Time 1300   SLP Stop Time  1400   SLP Time Calculation (min) 60 min   Activity Tolerance Patient tolerated treatment well      Past Medical History  Diagnosis Date  . Coronary artery disease   . Hypertension   . GERD (gastroesophageal reflux disease)   . Hyperlipidemia   . Parkinson disease ([place])   . RLS (restless legs syndrome)   . Renal disorder   . Arteriosclerosis of coronary artery 03/07/2011    Overview:  Non ST-elevation myocardial infarction.  Status post coronary artery bypass grafting x4 with left internal mammary artery to left anterior descending artery, saphenous vein graft to posterior descending artery, obtuse marginal 2 and obtuse marginal 1 individually on 02/10/2011, by [facility].   . Acid reflux 03/07/2011  . Carpal tunnel syndrome 03/26/2014  . 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    [name] Parkinson's disease ([place]) 03/07/2011  . HLD (hyperlipidemia) 04/29/2011    Overview:  Formatting of this note may be  different from the original. Component     Latest Ref Rng 02/10/2011 05/02/2011  Cholesterol, Total      171 114  Triglyceride      64 111  HDL Cholesterol      46 50  Low Density Lipoprotein      105 42    . Restless leg 03/21/2012  . Lipids serum increased   . RLS (restless legs syndrome)   . Dystonia   . Bilateral carpal tunnel syndrome   . Diverticulosis   . Erosive esophagitis   . History of hiatal hernia     Past Surgical History  Procedure Laterality Date  . Hernia repair    . Cardiac catheterization    . Coronary artery bypass graft    . Lithotripsy    . Hemmorrhoidectomy    . Hand surgery    . Neuroplasty / transposition median nerve at carpal tunnel Left   . Colonoscopy with propofol N/A 09/29/2014    Procedure: COLONOSCOPY WITH PROPOFOL;  Surgeon: [name], MD;  Location: [facility] ENDOSCOPY;  Service: Endoscopy;  Laterality: N/A;  . Esophagogastroduodenoscopy (egd) with propofol N/A 09/29/2014    Procedure: ESOPHAGOGASTRODUODENOSCOPY (EGD) WITH PROPOFOL;  Surgeon: [name], MD;  Location: [facility] ENDOSCOPY;  Service: Endoscopy;  Laterality: N/A;  . Esophagogastroduodenoscopy (egd) with propofol N/A 06/26/2015    Procedure: ESOPHAGOGASTRODUODENOSCOPY (EGD) WITH PROPOFOL;  Surgeon: [name], MD;  Location: [facility] ENDOSCOPY;  Service: Endoscopy;  Laterality: N/A;                Patient will benefit from skilled therapeutic intervention in order to improve the following deficits and impairments:   Dysphagia - Plan: DG OP Swallowing Func-Medicare/Speech Path, DG OP Swallowing Func-Medicare/Speech Path      G-Codes - 08-10-15 1526    Functional Assessment Tool Used clinical judgement   Functional Limitations Swallowing   Swallow Current Status [id number]) 0 percent impaired, limited or restricted   Swallow Goal Status [id number]) 0 percent impaired, limited or restricted   Swallow Discharge Status [id number]) 0 percent impaired, limited or restricted       Problem List Patient Active Problem List   Diagnosis Date Noted  . History of elevated PSA 06/28/2015  . 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 Nephrolithiasis 06/28/2015  . Carpal tunnel syndrome 03/26/2014  . 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 Pain of left hand 03/12/2014  . Restless leg 03/21/2012  . HLD (hyperlipidemia) 04/29/2011  . Arteriosclerosis of coronary artery 03/07/2011  . Essential (primary) hypertension 03/07/2011  . Acid reflux 03/07/2011  . History of surgical procedure 03/07/2011  . Parkinson's disease ([place]) 03/07/2011          Orinda Kenner, [place], CCC-SLP  Watson,Katherine Aug 10, 2015, 3:27 PM  [facility] [place], [place], [address] Phone: [phone]   Fax:     Name: Christian Thompson MRN: [id number] Date of Birth: 03-Jul-1950

## 2016-03-20 IMAGING — RF DG SWALLOWING FUNCTION - NRPT [facility]
13 of 24 series · 13 of 24 positions shown · non-contrast
Comparison: none

[Series 1: run · 1 of 27 frames shown (1 of 13)]
[frame 5/27]
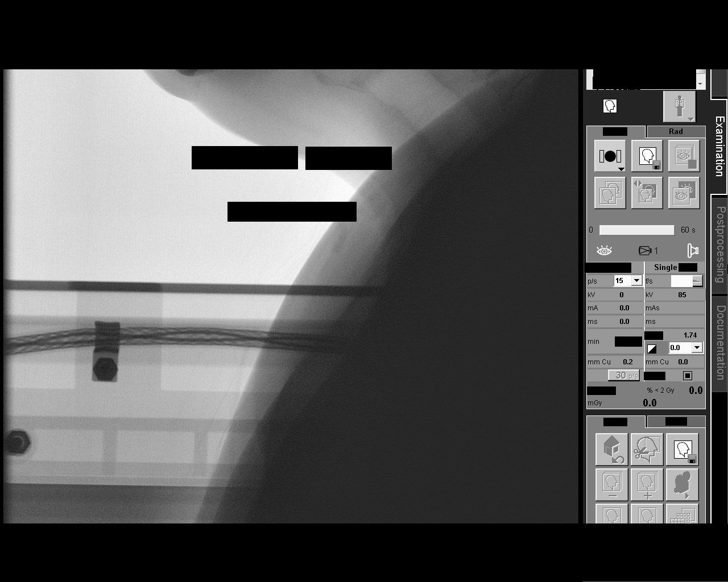

[Series 3: run · 1 of 107 frames shown (2 of 13)]
[frame 51/107]
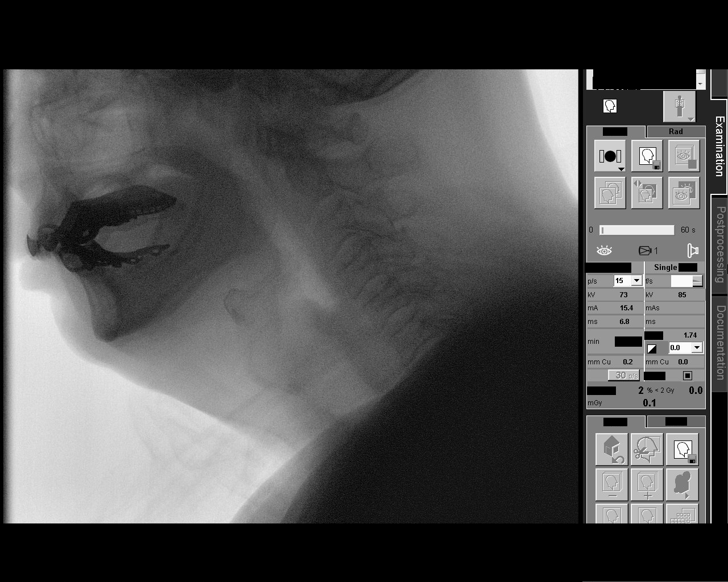

[Series 5: run · 1 of 70 frames shown (3 of 13)]
[frame 31/70]
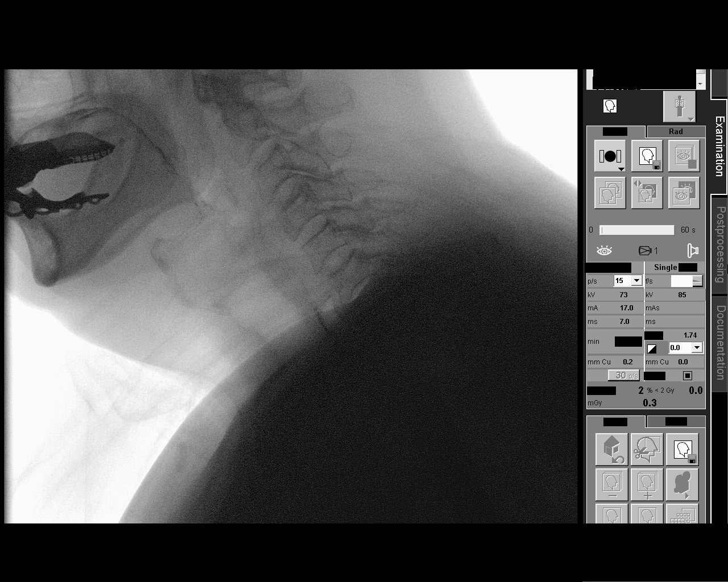

[Series 7: run · 1 of 115 frames shown (4 of 13)]
[frame 19/115]
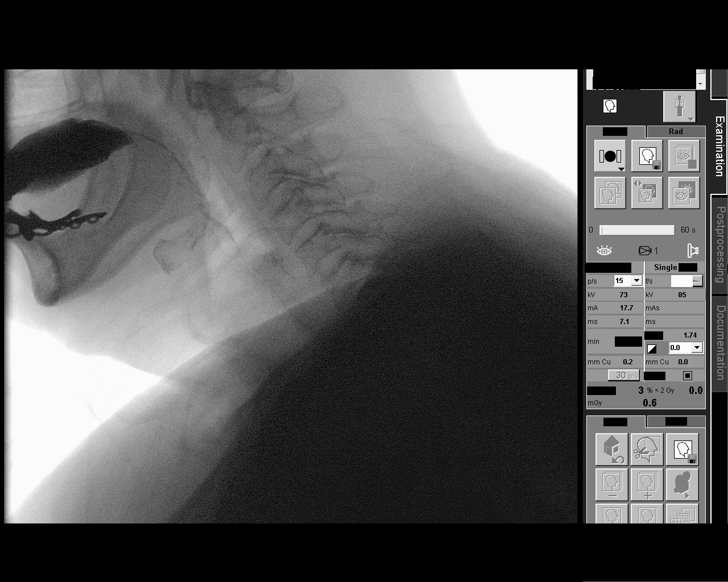

[Series 9: run · 1 of 15 frames shown (5 of 13)]
[frame 13/15]
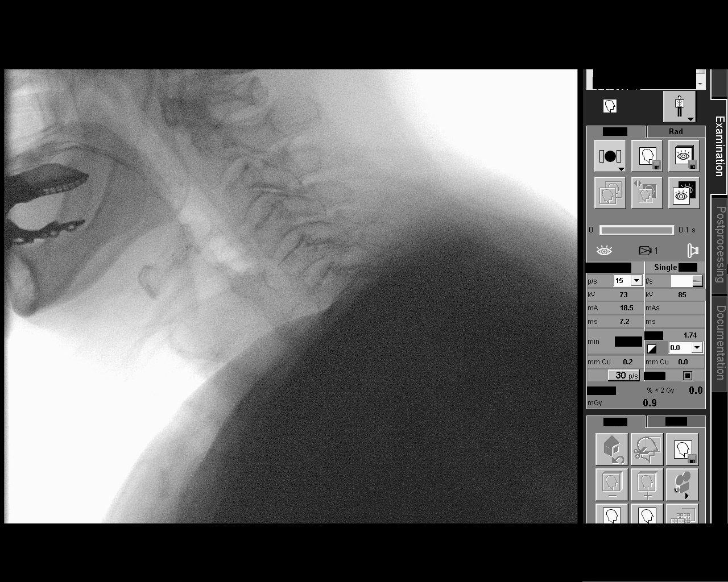

[Series 11: run · 1 of 91 frames shown (6 of 13)]
[frame 46/91]
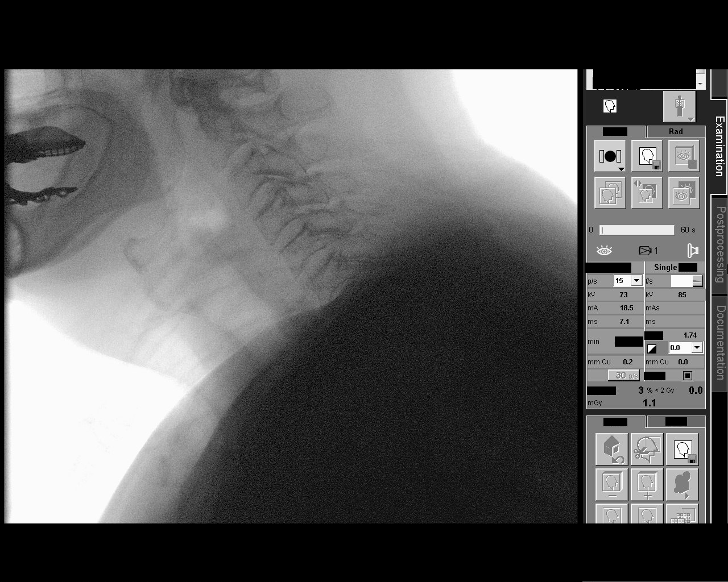

[Series 13: run · 1 of 166 frames shown (7 of 13)]
[frame 142/166]
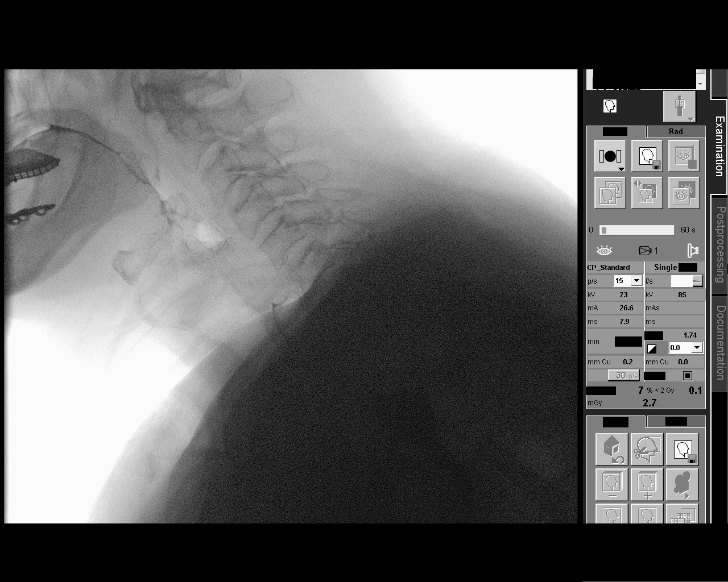

[Series 14: run · 1 of 140 frames shown (8 of 13)]
[frame 138/140]
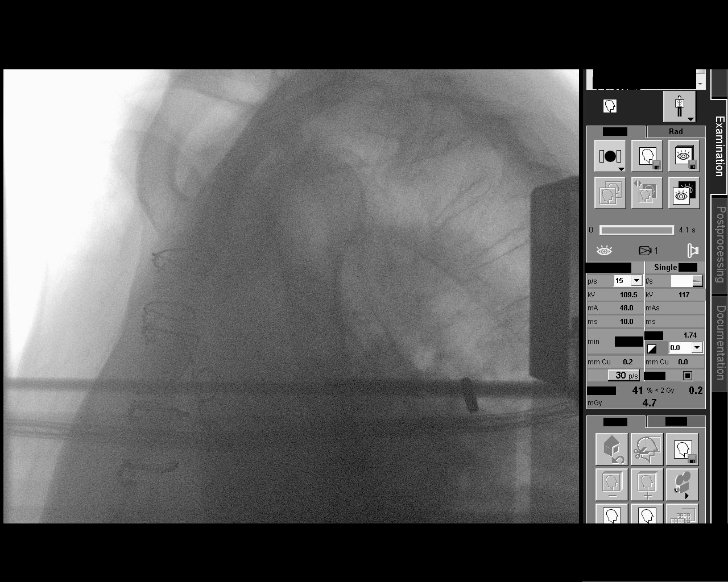

[Series 16: run · 1 of 39 frames shown (9 of 13)]
[frame 34/39]
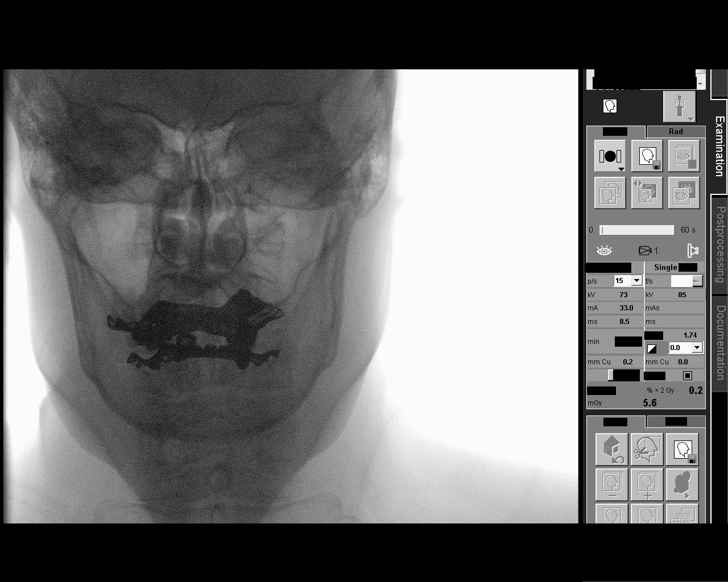

[Series 18: run · 1 of 105 frames shown (10 of 13)]
[frame 90/105]
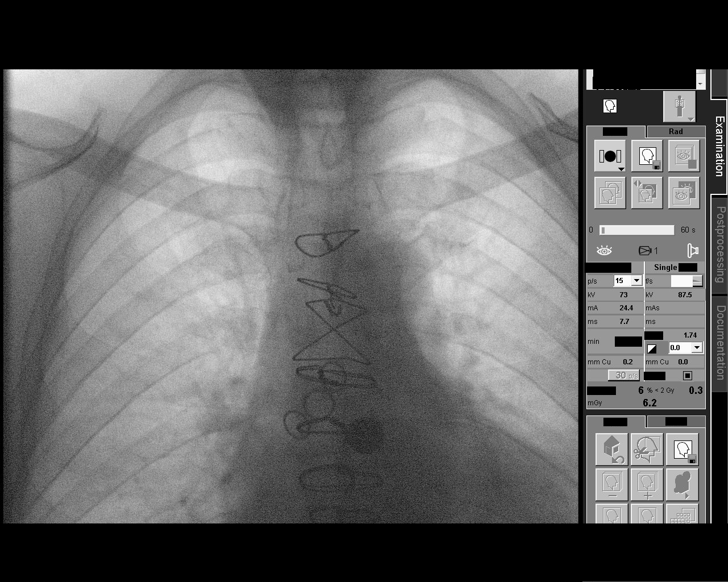

[Series 20: run · 1 of 29 frames shown (11 of 13)]
[frame 28/29]
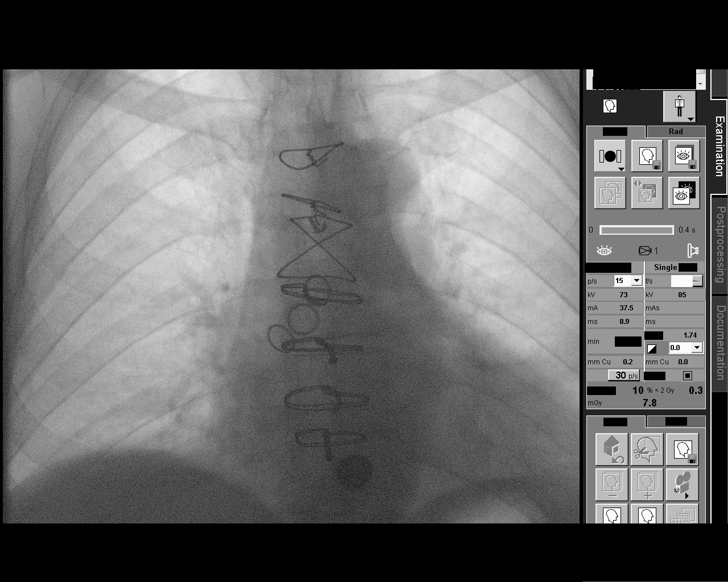

[Series 22: run · 1 of 221 frames shown (12 of 13)]
[frame 188/221]
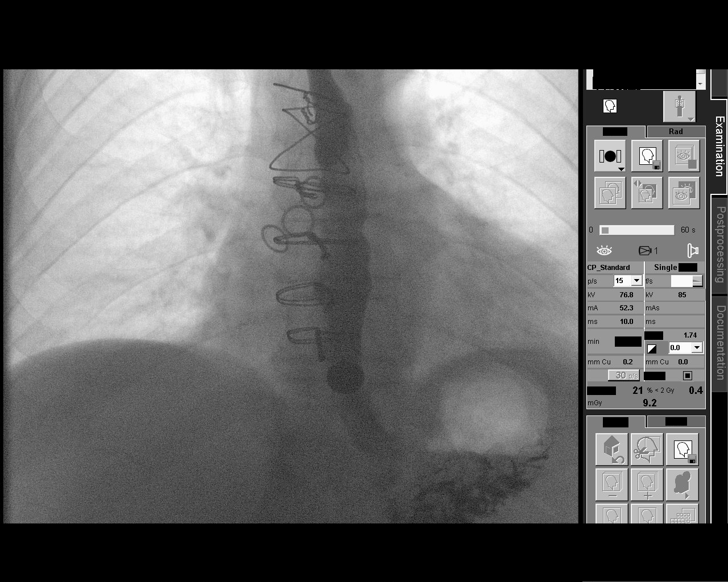

[Series 24: run · 1 of 79 frames shown (13 of 13)]
[frame 68/79]
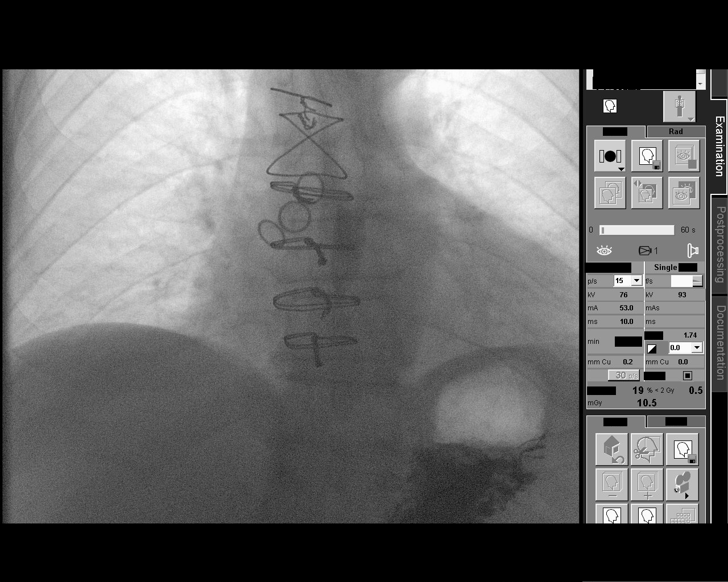

[13 of 24 positions shown; findings below may reference images not displayed]

FLUOROSCOPY FOR SWALLOWING FUNCTION STUDY:
Fluoroscopy was provided for swallowing function study, which was administered by a speech pathologist.  Final results and recommendations from this study are contained within the speech pathology report.

## 2016-06-20 ENCOUNTER — Other Ambulatory Visit: Admission: RE | Admit: 2016-06-20 | Payer: Medicare Other | Source: Ambulatory Visit | Admitting: Urology

## 2016-06-20 ENCOUNTER — Other Ambulatory Visit: Payer: Self-pay | Admitting: *Deleted

## 2016-06-20 ENCOUNTER — Other Ambulatory Visit: Payer: Medicare Other

## 2016-06-20 ENCOUNTER — Other Ambulatory Visit: Payer: Self-pay

## 2016-06-20 ENCOUNTER — Ambulatory Visit
Admission: RE | Admit: 2016-06-20 | Discharge: 2016-06-20 | Disposition: A | Discharge status: Home / Self Care | Payer: Medicare Other | Source: Ambulatory Visit | Attending: Urology | Admitting: Urology

## 2016-06-20 ENCOUNTER — Other Ambulatory Visit
Admission: RE | Admit: 2016-06-20 | Discharge: 2016-06-20 | Disposition: A | Discharge status: Home / Self Care | Payer: Medicare Other | Source: Ambulatory Visit | Attending: Urology | Admitting: Urology

## 2016-06-20 DIAGNOSIS — N200 Calculus of kidney: Secondary | ICD-10-CM

## 2016-06-20 DIAGNOSIS — N401 Enlarged prostate with lower urinary tract symptoms: Secondary | ICD-10-CM

## 2016-06-20 LAB — PSA: PSA: 2.58 ng/mL (ref 0.00–4.00)

## 2016-06-24 NOTE — Progress Notes (Signed)
06/27/2016 10:43 AM   Christian Thompson [birth year] [id number]  Referring provider: [name], MD [address], [place] [address]  Chief Complaint  Patient presents with  . Benign Prostatic Hypertrophy    1year    HPI: Patient is a 66 year old Caucasian male who presents today for a yearly follow up for a history of nephrolithiasis, history of elevated PSA and BPH with LUTS.  History of nephrolithiasis CT abdomen and pelvis showed completed on 05/28/2015 noted 2 left distal ureteral stones measuring up to 4 mm each. Spontaneous passage of stones.   RUS completed on 06/22/2015 noted resolution of the left-sided hydronephrosis and a right renal cyst.   Stone composition is unknown.  KUB taken on 06/20/2016 noted no nephrolithiasis. I have independently reviewed the films.    Gross hematuria Patient was having LLQ pain 2 weeks ago associated with gross hematuria.  He was having dysuria, but he was not having fevers, chills, nausea or vomiting.  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    History of elevated PSA/ possible prostate cancer 07/24/2012 2.1 08/2012 prostate biopsy - small focus of glands suspicious for prostate cancer, right lateral base 07/24/2013 2.38 01/20/14 2.28 06/25/15 2.9 06/20/16 2.58   BPH WITH LUTS His IPSS score today is 15, which is moderate lower urinary tract symptomatology. He is mixed with his quality life due to his urinary symptoms.  His previous I PSS score was 7/2 .  His major complaints today are urgency and incontinence.   He denies any dysuria, hematuria or suprapubic pain.  He currently taking tamsulosin 0.4 mg daily.  His has had cystolitholapaxy for bladder stones.  He also denies any recent fevers, chills, nausea or vomiting.  He does not have a family history of PCa.     IPSS    Row Name 06/27/16 1000         International Prostate Symptom Score   How often have you had the sensation of  not emptying your bladder? About half the time     How often have you had to urinate less than every two hours? Less than half the time     How often have you found you stopped and started again several times when you urinated? About half the time     How often have you found it difficult to postpone urination? More than half the time     How often have you had a weak urinary stream? About half the time     How often have you had to strain to start urination? Not at All     How many times did you typically get up at night to urinate? None     Total IPSS Score 15       Quality of Life due to urinary symptoms   If you were to spend the rest of your life with your urinary condition just the way it is now how would you feel about that? Mixed        Score:  1-7 Mild 8-19 Moderate 20-35 Severe   PMH: Past Medical History:  Diagnosis Date  . Acid reflux 03/07/2011  . Arteriosclerosis of coronary artery 03/07/2011   Overview:  Non ST-elevation myocardial infarction.  Status post coronary artery bypass grafting x4 with left internal mammary artery to left anterior descending artery, saphenous vein graft to posterior descending artery, obtuse marginal 2 and obtuse marginal 1 individually on 02/10/2011, by [facility]   . Bilateral carpal tunnel syndrome   . Carpal tunnel syndrome 03/26/2014  . Coronary artery disease   . Diverticulosis   . Dystonia   . Erosive esophagitis   . 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    [name] GERD (gastroesophageal reflux disease)   . History of hiatal hernia   . HLD (hyperlipidemia) 04/29/2011   Overview:  Formatting of this note may be different from the original. Component     Latest Ref Rng 02/10/2011 05/02/2011  Cholesterol, Total      171 114  Triglyceride      64 111  HDL Cholesterol      46 50  Low Density Lipoprotein      105 42    . Hyperlipidemia   . Hypertension   . Lipids serum increased   . Parkinson disease ([place])    . Parkinson's disease ([place]) 03/07/2011  . Renal disorder   . Restless leg 03/21/2012  . RLS (restless legs syndrome)   . RLS (restless legs syndrome)     Surgical History: Past Surgical History:  Procedure Laterality Date  . CARDIAC CATHETERIZATION    . COLONOSCOPY WITH PROPOFOL N/A 09/29/2014   Procedure: COLONOSCOPY WITH PROPOFOL;  Surgeon: [name], MD;  Location: [facility] ENDOSCOPY;  Service: Endoscopy;  Laterality: N/A;  . CORONARY ARTERY BYPASS GRAFT    . ESOPHAGOGASTRODUODENOSCOPY (EGD) WITH PROPOFOL N/A 09/29/2014   Procedure: ESOPHAGOGASTRODUODENOSCOPY (EGD) WITH PROPOFOL;  Surgeon: [name], MD;  Location: [facility] ENDOSCOPY;  Service: Endoscopy;  Laterality: N/A;  . ESOPHAGOGASTRODUODENOSCOPY (EGD) WITH PROPOFOL N/A 06/26/2015   Procedure: ESOPHAGOGASTRODUODENOSCOPY (EGD) WITH PROPOFOL;  Surgeon: [name], MD;  Location: [facility] ENDOSCOPY;  Service: Endoscopy;  Laterality: N/A;  . HAND SURGERY    . hemmorrhoidectomy    . HERNIA REPAIR    . LITHOTRIPSY    . NEUROPLASTY / TRANSPOSITION MEDIAN NERVE AT CARPAL TUNNEL Left     Home Medications:  Allergies as of 06/27/2016      Reactions   Penicillins    Tramadol       Medication List       Accurate as of 06/27/16 10:43 AM. Always use your most recent med list.          amitriptyline 10 MG tablet Commonly known as:  ELAVIL Take 10 mg by mouth at bedtime.   atorvastatin 80 MG tablet Commonly known as:  LIPITOR Take 80 mg by mouth daily.   carbidopa-levodopa 25-100 MG tablet Commonly known as:  SINEMET IR Take 1 tablet by mouth 3 (three) times daily.   citalopram 10 MG tablet Commonly known as:  CELEXA Take 10 mg by mouth daily.   cyanocobalamin 1000 MCG tablet Take 100 mcg by mouth daily.   glucosamine-chondroitin 500-400 MG tablet Take 1 tablet by mouth 3 (three) times daily.   lisinopril 5 MG tablet Commonly known as:  PRINIVIL,ZESTRIL Take 5 mg by mouth daily.   metoprolol succinate 25  MG 24 hr tablet Commonly known as:  TOPROL-XL Take 25 mg by mouth daily.   multivitamin tablet Take 1 tablet by mouth daily.   NASACORT AQ 55 MCG/ACT Aero nasal inhaler Generic drug:  triamcinolone Place 2 sprays into the nose daily.   nitroGLYCERIN 0.4 MG SL tablet Commonly known as:  NITROSTAT Place 0.4 mg under the tongue every 5 (five) minutes as needed for chest pain.   omega-3 acid ethyl esters 1 g capsule  Commonly known as:  LOVAZA Take by mouth 2 (two) times daily.   pantoprazole 40 MG tablet Commonly known as:  PROTONIX Take 40 mg by mouth daily.   rOPINIRole 0.5 MG tablet Commonly known as:  REQUIP Take 0.5 mg by mouth 3 (three) times daily.   tamsulosin 0.4 MG Caps capsule Commonly known as:  FLOMAX Take 1 capsule (0.4 mg total) by mouth daily.   vitamin C 500 MG tablet Commonly known as:  ASCORBIC ACID Take 500 mg by mouth daily.       Allergies:  Allergies  Allergen Reactions  . Penicillins   . Tramadol     Family History: Family History  Problem Relation Age of Onset  . Asthma Mother   . Alzheimer's disease Father   . Emphysema Mother     Social History:  reports that he has never smoked. He quit smokeless tobacco use about 9 years ago. He reports that he does not drink alcohol or use drugs.  ROS: UROLOGY Frequent Urination?: No Hard to postpone urination?: Yes Burning/pain with urination?: No Get up at night to urinate?: No Leakage of urine?: Yes Urine stream starts and stops?: No Trouble starting stream?: No Do you have to strain to urinate?: No Blood in urine?: No Urinary tract infection?: No Sexually transmitted disease?: No Injury to kidneys or bladder?: No Painful intercourse?: No Weak stream?: No Erection problems?: No Penile pain?: No  Gastrointestinal Nausea?: No Vomiting?: No Indigestion/heartburn?: Yes Diarrhea?: No Constipation?: No  Constitutional Fever: No Night sweats?: No Weight loss?: No Fatigue?:  No  Skin Skin rash/lesions?: No Itching?: No  Eyes Blurred vision?: No Double vision?: No  Ears/Nose/Throat Sore throat?: No Sinus problems?: Yes  Hematologic/Lymphatic Swollen glands?: No Easy bruising?: No  Cardiovascular Leg swelling?: Yes Chest pain?: No  Respiratory Cough?: Yes Shortness of breath?: No  Endocrine Excessive thirst?: No  Musculoskeletal Back pain?: No Joint pain?: No  Neurological Headaches?: No Dizziness?: No  Psychologic Depression?: No Anxiety?: No  Physical Exam: BP (!) 144/75   Pulse (!) 58   Ht 5\' 5"  (1.651 m)   Wt 160 lb (72.6 kg)   BMI 26.63 kg/m   Constitutional: Well nourished. Alert and oriented, No acute distress. HEENT: [place] AT, moist mucus membranes. Trachea midline, no masses. Cardiovascular: No clubbing, cyanosis, or edema. Respiratory: Normal respiratory effort, no increased work of breathing. GI: Abdomen is soft, non tender, non distended, no abdominal masses. Liver and spleen not palpable.  No hernias appreciated.  Stool sample for occult testing is not indicated.   GU: No CVA tenderness.  No bladder fullness or masses.  Patient with uncircumcised phallus.  Foreskin could not be easily retracted.  Aperture approximately 5 mm.   Urethral meatus is patent.  No penile discharge. No penile lesions or rashes. Scrotum without lesions, cysts, rashes and/or edema.  Testicles are located scrotally bilaterally. No masses are appreciated in the testicles. Left and right epididymis are normal. Rectal: Patient with  normal sphincter tone. Anus and perineum without scarring or rashes. No rectal masses are appreciated. Prostate is approximately 70 grams, no nodules are appreciated. Seminal vesicles are normal. Skin: No rashes, bruises or suspicious lesions. Lymph: No cervical or inguinal adenopathy. Neurologic: Grossly intact, no focal deficits, moving all 4 extremities. Psychiatric: Normal mood and affect.  Laboratory Data: Lab  Results  Component Value Date   WBC 12.1 (H) 05/28/2015   HGB 14.8 05/28/2015   HCT 43.8 05/28/2015   MCV 87.5 05/28/2015   PLT  150 05/28/2015    Lab Results  Component Value Date   CREATININE 1.13 05/28/2015    Lab Results  Component Value Date   AST 30 05/28/2015   Lab Results  Component Value Date   ALT 13 (L) 05/28/2015    Pertinent Imaging: CLINICAL DATA:  Onset hematuria yesterday and left lower quadrant pain as well. History of kidney stones and diverticulitis.  EXAM: ABDOMEN - 1 VIEW  COMPARISON:  Abdominal and pelvic CT scan of May 28, 2015  FINDINGS: The bowel gas pattern is normal. There are surgical metallic coils in the pelvis. No abnormal calcifications project over either kidney nor along the course of the ureters. The visualized bony structures exhibit no acute abnormalities.  IMPRESSION: No definite calcified urinary tract stones are observed. Given the patient's history, CT scanning or renal ultrasound would be useful next imaging steps.   Electronically Signed   By: David  Martinique M.D.   On: 06/20/2016 10:27  Assessment & Plan:    1. Gross hematuria  - I explained to the patient that there are a number of causes that can be associated with blood in the urine, such as stones,  BPH, UTI's, damage to the urinary tract and/or cancer.  - At this time, I felt that the patient warranted further urologic evaluation.   The AUA guidelines state that a CT urogram is the preferred imaging study to evaluate hematuria.  - I explained to the patient that a contrast material will be injected into a vein and that in rare instances, an allergic reaction can result and may even life threatening   The patient denies any allergies to contrast, iodine and/or seafood and is not taking metformin  - Following the imaging study,  I've recommended a cystoscopy. I described how this is performed, typically in an office setting with a flexible cystoscope. We  described the risks, benefits, and possible side effects, the most common of which is a minor amount of blood in the urine and/or burning which usually resolves in 24 to 48 hours.    - The patient had the opportunity to ask questions which were answered. Based upon this discussion, the patient is willing to proceed. Therefore, I've ordered: a CT Urogram and cystoscopy.  - The patient will return following all of the above for discussion of the results.   - UA  - Urine culture  - BUN + creatinine    2. History of elevated PSA:  History of previously elevated PSA and possible prostate cancer (suspicious glands) on previous biopsy PSA has remained stable.    RTC in one year for exam and PSA.    3. History of nephrolithiasis:   Hx of stone episode with 4 mm distal ureteral stone 2, hydronephrosis. No recent flank pain.  RUS was negative.  KUB today negative for stones.  CTU pending.    4. BPH with LUTS:   IPSS score is 15/3.  [name]    He will continue the tamsulosin 0.4 mg daily.   Not interested in starting finasteride.  He will have his IPSS score, exam and PSA in 12 months.   5. Phimosis  - will address further after hematuria workup   Return for CT Urogram report and cystoscopy.  These notes generated with voice recognition software. I apologize for typographical errors.  Zara Council, [facility] [address] [place], [place] [address] [phone]

## 2016-06-27 ENCOUNTER — Encounter: Payer: Self-pay | Admitting: Urology

## 2016-06-27 ENCOUNTER — Ambulatory Visit (INDEPENDENT_AMBULATORY_CARE_PROVIDER_SITE_OTHER): Payer: Medicare Other | Admitting: Urology

## 2016-06-27 VITALS — BP 144/75 | HR 58 | Ht 65.0 in | Wt 160.0 lb

## 2016-06-27 DIAGNOSIS — Z87442 Personal history of urinary calculi: Secondary | ICD-10-CM | POA: Diagnosis not present

## 2016-06-27 DIAGNOSIS — N471 Phimosis: Secondary | ICD-10-CM

## 2016-06-27 DIAGNOSIS — Z87898 Personal history of other specified conditions: Secondary | ICD-10-CM | POA: Diagnosis not present

## 2016-06-27 DIAGNOSIS — N138 Other obstructive and reflux uropathy: Secondary | ICD-10-CM

## 2016-06-27 DIAGNOSIS — R310 Gross hematuria: Secondary | ICD-10-CM

## 2016-06-27 DIAGNOSIS — N401 Enlarged prostate with lower urinary tract symptoms: Secondary | ICD-10-CM

## 2016-06-27 LAB — URINALYSIS, COMPLETE
Bilirubin, UA: NEGATIVE
Glucose, UA: NEGATIVE
Ketones, UA: NEGATIVE
Leukocytes, UA: NEGATIVE
Nitrite, UA: NEGATIVE
PH UA: 6 (ref 5.0–7.5)
RBC, UA: NEGATIVE
Specific Gravity, UA: >1.03 — ABNORMAL HIGH (ref 1.005–1.030)
UUROB: 0.2 mg/dL (ref 0.2–1.0)

## 2016-06-27 LAB — MICROSCOPIC EXAMINATION
BACTERIA UA: NONE SEEN
EPITHELIAL CELLS (NON RENAL): NONE SEEN /HPF (ref 0–10)

## 2016-06-28 LAB — BUN+CREAT
BUN/Creatinine Ratio: 12 (ref 10–24)
BUN: 12 mg/dL (ref 8–27)
Creatinine, Ser: 1.03 mg/dL (ref 0.76–1.27)
GFR, EST AFRICAN AMERICAN: 87 mL/min/{1.73_m2} (ref >59)
GFR, EST NON AFRICAN AMERICAN: 75 mL/min/{1.73_m2} (ref >59)

## 2016-06-30 LAB — CULTURE, URINE COMPREHENSIVE

## 2016-07-13 ENCOUNTER — Ambulatory Visit
Admission: RE | Admit: 2016-07-13 | Discharge: 2016-07-13 | Disposition: A | Discharge status: Home / Self Care | Payer: Medicare Other | Source: Ambulatory Visit | Attending: Urology | Admitting: Urology

## 2016-07-13 DIAGNOSIS — N400 Enlarged prostate without lower urinary tract symptoms: Secondary | ICD-10-CM | POA: Diagnosis not present

## 2016-07-13 DIAGNOSIS — N281 Cyst of kidney, acquired: Secondary | ICD-10-CM | POA: Insufficient documentation

## 2016-07-13 DIAGNOSIS — N2889 Other specified disorders of kidney and ureter: Secondary | ICD-10-CM | POA: Insufficient documentation

## 2016-07-13 DIAGNOSIS — R310 Gross hematuria: Secondary | ICD-10-CM | POA: Diagnosis not present

## 2016-07-13 DIAGNOSIS — I2510 Atherosclerotic heart disease of native coronary artery without angina pectoris: Secondary | ICD-10-CM | POA: Insufficient documentation

## 2016-07-13 DIAGNOSIS — I700 Atherosclerosis of aorta: Secondary | ICD-10-CM | POA: Insufficient documentation

## 2016-07-13 MED ORDER — IOPAMIDOL (ISOVUE-300) INJECTION 61%
125.0000 mL | Freq: Once | INTRAVENOUS | Status: AC | PRN
  Administered 2016-07-13: 125 mL via INTRAVENOUS

## 2016-07-14 ENCOUNTER — Telehealth: Payer: Self-pay | Admitting: Urology

## 2016-07-14 DIAGNOSIS — R1900 Intra-abdominal and pelvic swelling, mass and lump, unspecified site: Secondary | ICD-10-CM

## 2016-07-14 NOTE — Telephone Encounter (Signed)
CT scan findings were reviewed today with the patient by telephone. 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 As such, a follow-up pelvic MRI was recommended by the radiologist. We will plan to order this and if concerning, we'll make the appropriate referrals. He understands and is willing to proceed with further diagnostic workup. Order placed today. He will keep his cystoscopy follow up next week as scheduled.  Hollice Espy, MD

## 2016-07-19 ENCOUNTER — Ambulatory Visit (INDEPENDENT_AMBULATORY_CARE_PROVIDER_SITE_OTHER): Payer: Medicare Other | Admitting: Urology

## 2016-07-19 ENCOUNTER — Encounter: Payer: Self-pay | Admitting: Urology

## 2016-07-19 VITALS — BP 110/67 | HR 50 | Ht 65.0 in | Wt 160.0 lb

## 2016-07-19 DIAGNOSIS — R3915 Urgency of urination: Secondary | ICD-10-CM

## 2016-07-19 DIAGNOSIS — N138 Other obstructive and reflux uropathy: Secondary | ICD-10-CM | POA: Diagnosis not present

## 2016-07-19 DIAGNOSIS — N401 Enlarged prostate with lower urinary tract symptoms: Secondary | ICD-10-CM | POA: Diagnosis not present

## 2016-07-19 DIAGNOSIS — R310 Gross hematuria: Secondary | ICD-10-CM

## 2016-07-19 LAB — URINALYSIS, COMPLETE
BILIRUBIN UA: NEGATIVE
Glucose, UA: NEGATIVE
Leukocytes, UA: NEGATIVE
NITRITE UA: NEGATIVE
PH UA: 5.5 (ref 5.0–7.5)
Protein, UA: NEGATIVE
RBC UA: NEGATIVE
Urobilinogen, Ur: 0.2 mg/dL (ref 0.2–1.0)

## 2016-07-19 LAB — MICROSCOPIC EXAMINATION: RBC MICROSCOPIC, UA: NONE SEEN /HPF (ref 0–?)

## 2016-07-19 MED ORDER — FINASTERIDE 5 MG PO TABS: 5.0000 mg | ORAL_TABLET | Freq: Every day | ORAL | 11 refills | Status: DC

## 2016-07-19 MED ORDER — CIPROFLOXACIN HCL 500 MG PO TABS
500.0000 mg | ORAL_TABLET | Freq: Once | ORAL | Status: AC
  Administered 2016-07-19: 500 mg via ORAL

## 2016-07-19 MED ORDER — LIDOCAINE HCL 2 % EX GEL
1.0000 "application " | Freq: Once | CUTANEOUS | Status: AC
  Administered 2016-07-19: 1 via URETHRAL

## 2016-07-19 NOTE — Progress Notes (Signed)
   07/19/16  CC:  Chief Complaint  Patient presents with  . Cysto    HPI: Patient returns for cystoscopy and to discuss CT findings of pelvic mass and differential diagnosis. He has a lot of questions about that. Also, to address BPH and symptoms.   He has a history of gross hematuria. See 06/27/2016 note.  Additionally, on the 07/13/2016 CT hematuria scan he had a 6.6 x 4.9 x 5.7 cm fatty attenuation lesion extending through the right obturator foramen. This was slightly increased in size from a April 2017 CT when it measured 6.5 x 5.1 cm. Given the fat attenuation differential diagnosis includes lipoma or low-grade liposarcoma. There were no aggressive findings such as lytic or blastic lesions of the bones and no lymphadenopathy. MRI pending.   Also, he has Parkinson's. His IPSS score was 15, which is moderate lower urinary tract symptomatology. He is mixed with his quality life due to his urinary symptoms.  His previous I PSS score was 7/2 .  His major complaints today are urgency and incontinence.   He denies any dysuria, hematuria or suprapubic pain.  He currently taking tamsulosin 0.4 mg daily. DRE normal May 2018. He had obstruction from a median lobe on cysto today.   Blood pressure 110/67, pulse (!) 50, height 5\' 5"  (1.651 m), weight 72.6 kg (160 lb). NED. A&Ox3.   No respiratory distress   Abd soft, NT, ND Normal phallus with bilateral descended testicles  Cystoscopy Procedure Note  Patient identification was confirmed, informed consent was obtained, and patient was prepped using Betadine solution.  Lidocaine jelly was administered per urethral meatus.    Preoperative abx where received prior to procedure.     Pre-Procedure: - Inspection reveals a normal caliber ureteral meatus.  Procedure: The flexible cystoscope was introduced without difficulty - No urethral strictures/lesions are present. - obstructing median lobe of prostate - elevated bladder neck - Bilateral  ureteral orifices identified - Bladder mucosa  reveals no ulcers, tumors, or lesions - No bladder stones - No trabeculation  Retroflexion shows median lobe protruding over bladder neck.    Post-Procedure: - Patient tolerated the procedure well  Assessment/ Plan:  1) gross hematuria - benign eval.   2) pelvic fatty tumor - slight increase in size over one year. MRI of pelvis pending. Discussed importance of f/u of MRI findings with patient.   3) BPH - on tamsulosin. Also with Parkinson;s. Given his worsening symptoms and gross hematuria likely from BPH we discussed again the nature risks and benefits of finasteride. I discussed FDA warnings. They elect to start.

## 2016-07-28 ENCOUNTER — Ambulatory Visit
Admission: RE | Admit: 2016-07-28 | Discharge: 2016-07-28 | Disposition: A | Discharge status: Home / Self Care | Payer: Medicare Other | Source: Ambulatory Visit | Attending: Urology | Admitting: Urology

## 2016-07-28 DIAGNOSIS — R1900 Intra-abdominal and pelvic swelling, mass and lump, unspecified site: Secondary | ICD-10-CM | POA: Diagnosis not present

## 2016-07-28 MED ORDER — GADOBENATE DIMEGLUMINE 529 MG/ML IV SOLN
15.0000 mL | Freq: Once | INTRAVENOUS | Status: AC | PRN
  Administered 2016-07-28: 15 mL via INTRAVENOUS

## 2016-08-02 ENCOUNTER — Telehealth: Payer: Self-pay | Admitting: Urology

## 2016-08-02 DIAGNOSIS — E882 Lipomatosis, not elsewhere classified: Secondary | ICD-10-CM

## 2016-08-02 NOTE — Telephone Encounter (Signed)
MRI of the pelvis was initiated by telephone with the patient. This appears to be benign in nature. Follow-up MRI in 6 months as recommended by radiologist. Christian Thompson order this study. All questions were answered.  Patient has follow-up with [place] next month.  Hollice Espy, MD

## 2016-09-05 ENCOUNTER — Telehealth: Payer: Self-pay | Admitting: Urology

## 2016-09-05 DIAGNOSIS — R1900 Intra-abdominal and pelvic swelling, mass and lump, unspecified site: Secondary | ICD-10-CM

## 2016-09-05 NOTE — Telephone Encounter (Signed)
Per scheduling they had a message on this order saying that this Needs to be changed to a With and Without - We dont sch a With only Per protocol.  Can you check this and let me know if this needs to be changed or not. If so we just need a new order.  Thanks, Sharyn Lull

## 2016-09-05 NOTE — Telephone Encounter (Signed)
OK done.  To be clear, this is 6 months from now.    Hollice Espy, MD

## 2016-09-06 NOTE — Telephone Encounter (Signed)
Yes, I just need to have the order changed and I will defer it.   Thanks, Sharyn Lull

## 2016-09-07 ENCOUNTER — Ambulatory Visit: Payer: Medicare Other | Admitting: Urology

## 2016-10-21 ENCOUNTER — Encounter: Payer: Self-pay | Admitting: Emergency Medicine

## 2016-10-21 ENCOUNTER — Emergency Department
Admission: EM | Admit: 2016-10-21 | Discharge: 2016-10-21 | Disposition: A | Discharge status: Home / Self Care | Payer: Medicare Other | Attending: Emergency Medicine | Admitting: Emergency Medicine

## 2016-10-21 ENCOUNTER — Emergency Department: Payer: Medicare Other

## 2016-10-21 DIAGNOSIS — I259 Chronic ischemic heart disease, unspecified: Secondary | ICD-10-CM | POA: Insufficient documentation

## 2016-10-21 DIAGNOSIS — I10 Essential (primary) hypertension: Secondary | ICD-10-CM | POA: Insufficient documentation

## 2016-10-21 DIAGNOSIS — Y939 Activity, unspecified: Secondary | ICD-10-CM | POA: Insufficient documentation

## 2016-10-21 DIAGNOSIS — G20 Parkinson's disease: Secondary | ICD-10-CM | POA: Insufficient documentation

## 2016-10-21 DIAGNOSIS — Z79899 Other long term (current) drug therapy: Secondary | ICD-10-CM | POA: Diagnosis not present

## 2016-10-21 DIAGNOSIS — Y929 Unspecified place or not applicable: Secondary | ICD-10-CM | POA: Insufficient documentation

## 2016-10-21 DIAGNOSIS — Y999 Unspecified external cause status: Secondary | ICD-10-CM | POA: Diagnosis not present

## 2016-10-21 DIAGNOSIS — S0090XA Unspecified superficial injury of unspecified part of head, initial encounter: Secondary | ICD-10-CM | POA: Diagnosis present

## 2016-10-21 DIAGNOSIS — S0990XA Unspecified injury of head, initial encounter: Secondary | ICD-10-CM

## 2016-10-21 MED ORDER — METAXALONE 800 MG PO TABS: 800.0000 mg | ORAL_TABLET | Freq: Three times a day (TID) | ORAL | 0 refills | Status: AC
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
## 2016-10-21 NOTE — ED Notes (Signed)
Pt with hematoma posterior head. No bleeding. No c/o shoulder, chest, arm, abd, hips, knee or leg pain. Able to move all ext.
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
## 2016-10-21 NOTE — Discharge Instructions (Signed)
Return to the emergency department for any symptom of concern if you are unable to see her primary care provider.
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
## 2016-10-24 ENCOUNTER — Encounter: Payer: Self-pay | Admitting: *Deleted

## 2016-10-24 ENCOUNTER — Ambulatory Visit: Payer: Medicare Other

## 2016-10-24 ENCOUNTER — Ambulatory Visit
Admission: EM | Admit: 2016-10-24 | Discharge: 2016-10-24 | Disposition: A | Discharge status: Home / Self Care | Payer: Medicare Other | Attending: Family Medicine | Admitting: Family Medicine

## 2016-10-24 DIAGNOSIS — K219 Gastro-esophageal reflux disease without esophagitis: Secondary | ICD-10-CM | POA: Insufficient documentation

## 2016-10-24 DIAGNOSIS — Z79899 Other long term (current) drug therapy: Secondary | ICD-10-CM | POA: Insufficient documentation

## 2016-10-24 DIAGNOSIS — G20 Parkinson's disease: Secondary | ICD-10-CM | POA: Diagnosis not present

## 2016-10-24 DIAGNOSIS — N200 Calculus of kidney: Secondary | ICD-10-CM | POA: Insufficient documentation

## 2016-10-24 DIAGNOSIS — Z23 Encounter for immunization: Secondary | ICD-10-CM | POA: Diagnosis not present

## 2016-10-24 DIAGNOSIS — E785 Hyperlipidemia, unspecified: Secondary | ICD-10-CM | POA: Diagnosis not present

## 2016-10-24 DIAGNOSIS — M79642 Pain in left hand: Secondary | ICD-10-CM | POA: Insufficient documentation

## 2016-10-24 DIAGNOSIS — I10 Essential (primary) hypertension: Secondary | ICD-10-CM | POA: Insufficient documentation

## 2016-10-24 DIAGNOSIS — G249 Dystonia, unspecified: Secondary | ICD-10-CM | POA: Diagnosis not present

## 2016-10-24 DIAGNOSIS — Z880 Allergy status to penicillin: Secondary | ICD-10-CM | POA: Diagnosis not present

## 2016-10-24 DIAGNOSIS — S62521A Displaced fracture of distal phalanx of right thumb, initial encounter for closed fracture: Secondary | ICD-10-CM | POA: Insufficient documentation

## 2016-10-24 DIAGNOSIS — Z888 Allergy status to other drugs, medicaments and biological substances status: Secondary | ICD-10-CM | POA: Insufficient documentation

## 2016-10-24 DIAGNOSIS — Z9889 Other specified postprocedural states: Secondary | ICD-10-CM | POA: Insufficient documentation

## 2016-10-24 DIAGNOSIS — K449 Diaphragmatic hernia without obstruction or gangrene: Secondary | ICD-10-CM | POA: Diagnosis not present

## 2016-10-24 DIAGNOSIS — Z820 Family history of epilepsy and other diseases of the nervous system: Secondary | ICD-10-CM | POA: Diagnosis not present

## 2016-10-24 DIAGNOSIS — Z825 Family history of asthma and other chronic lower respiratory diseases: Secondary | ICD-10-CM | POA: Diagnosis not present

## 2016-10-24 DIAGNOSIS — X58XXXA Exposure to other specified factors, initial encounter: Secondary | ICD-10-CM | POA: Diagnosis not present

## 2016-10-24 DIAGNOSIS — I252 Old myocardial infarction: Secondary | ICD-10-CM | POA: Insufficient documentation

## 2016-10-24 DIAGNOSIS — S61011A Laceration without foreign body of right thumb without damage to nail, initial encounter: Secondary | ICD-10-CM | POA: Insufficient documentation

## 2016-10-24 DIAGNOSIS — S6701XA Crushing injury of right thumb, initial encounter: Secondary | ICD-10-CM | POA: Diagnosis not present

## 2016-10-24 DIAGNOSIS — Z951 Presence of aortocoronary bypass graft: Secondary | ICD-10-CM | POA: Insufficient documentation

## 2016-10-24 DIAGNOSIS — G2581 Restless legs syndrome: Secondary | ICD-10-CM | POA: Diagnosis not present

## 2016-10-24 DIAGNOSIS — S61111A Laceration without foreign body of right thumb with damage to nail, initial encounter: Secondary | ICD-10-CM | POA: Diagnosis not present

## 2016-10-24 DIAGNOSIS — I2510 Atherosclerotic heart disease of native coronary artery without angina pectoris: Secondary | ICD-10-CM | POA: Insufficient documentation

## 2016-10-24 DIAGNOSIS — S62521B Displaced fracture of distal phalanx of right thumb, initial encounter for open fracture: Secondary | ICD-10-CM | POA: Diagnosis not present

## 2016-10-24 DIAGNOSIS — G5603 Carpal tunnel syndrome, bilateral upper limbs: Secondary | ICD-10-CM | POA: Insufficient documentation

## 2016-10-24 MED ORDER — CEPHALEXIN 500 MG PO CAPS: 500.0000 mg | ORAL_CAPSULE | Freq: Four times a day (QID) | ORAL | 0 refills | Status: AC

## 2016-10-24 MED ORDER — TETANUS-DIPHTH-ACELL PERTUSSIS 5-2.5-18.5 LF-MCG/0.5 IM SUSP
0.5000 mL | Freq: Once | INTRAMUSCULAR | Status: AC
  Administered 2016-10-24: 0.5 mL via INTRAMUSCULAR

## 2016-10-24 MED ORDER — LIDOCAINE HCL (PF) 1 % IJ SOLN: 10.0000 mL | Freq: Once | INTRAMUSCULAR | Status: DC

## 2016-10-24 MED ORDER — MUPIROCIN 2 % EX OINT: TOPICAL_OINTMENT | CUTANEOUS | 0 refills | Status: DC

## 2016-10-24 NOTE — ED Provider Notes (Signed)
[facility] ____________________________________________  Time seen: Approximately 2:00 PM  I have reviewed the triage vital signs and the nursing notes.   HISTORY  Chief Complaint Laceration   HPI Christian Thompson is a 66 y.o. male presenting with wife at bedside for evaluation of right thumb laceration that occurred yesterday at approximately 4 PM at home. Patient states that he was using an automatic clamp that he actually got his thumb and and it crushed the distal tip of his thumb causing pain and laceration. Patient states that they clean this at home and put Steri-Strips, but states he realizes tetanus is out of date prompted him to come in today. States last tetanus immunization greater than 10 years ago. States he has had continued mild pain to right thumb, moderate with direct palpation or pressure. Denies drainage. States area has continued to swell and has slightly limited range of motion, denies any paresthesias or decreased sensation. Denies issues to this area in the past. Reports previously left-hand dominant, however due to his Parkinson's he is now right-hand dominant. Denies other pain or injuries. No other alleviating measures attempted. Reports otherwise feels well. Denies recent sickness. Denies recent antibiotic use.   [name] PCP   Past Medical History:  Diagnosis Date  . Acid reflux 03/07/2011  . Arteriosclerosis of coronary artery 03/07/2011   Overview:  Non ST-elevation myocardial infarction.  Status post coronary artery bypass grafting x4 with left internal mammary artery to left anterior descending artery, saphenous vein graft to posterior descending artery, obtuse marginal 2 and obtuse marginal 1 individually on 02/10/2011, by [facility].   . Bilateral carpal tunnel syndrome   . Carpal tunnel syndrome 03/26/2014  . Coronary artery disease   . Diverticulosis   . Dystonia   . Erosive esophagitis   . 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    [name] GERD (gastroesophageal reflux disease)   . History of hiatal hernia   . HLD (hyperlipidemia) 04/29/2011   Overview:  Formatting of this note may be different from the original. Component     Latest Ref Rng 02/10/2011 05/02/2011  Cholesterol, Total      171 114  Triglyceride      64 111  HDL Cholesterol      46 50  Low Density Lipoprotein      105 42    . Hyperlipidemia   . Hypertension   . Lipids serum increased   . Parkinson disease ([place])   . Parkinson's disease ([place]) 03/07/2011  . Renal disorder   . Restless leg 03/21/2012  . RLS (restless legs syndrome)   . RLS (restless legs syndrome)     Patient Active Problem List   Diagnosis Date Noted  . History of elevated PSA 06/28/2015  . 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 Nephrolithiasis 06/28/2015  . Carpal tunnel syndrome 03/26/2014  . 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 Pain of left hand 03/12/2014  . Restless leg 03/21/2012  . HLD (hyperlipidemia) 04/29/2011  . Arteriosclerosis of coronary artery 03/07/2011  . Essential (primary) hypertension 03/07/2011  . Acid reflux 03/07/2011  . History of surgical procedure 03/07/2011  . Parkinson's disease ([place]) 03/07/2011    Past Surgical History:  Procedure Laterality Date  . CARDIAC CATHETERIZATION    . COLONOSCOPY WITH PROPOFOL N/A 09/29/2014   Procedure: COLONOSCOPY WITH PROPOFOL;  Surgeon: [name], MD;  Location: [facility] ENDOSCOPY;  Service: Endoscopy;  Laterality: N/A;  .  CORONARY ARTERY BYPASS GRAFT    . ESOPHAGOGASTRODUODENOSCOPY (EGD) WITH PROPOFOL N/A 09/29/2014   Procedure: ESOPHAGOGASTRODUODENOSCOPY (EGD) WITH PROPOFOL;  Surgeon: [name], MD;  Location: [facility] ENDOSCOPY;  Service: Endoscopy;  Laterality: N/A;  . ESOPHAGOGASTRODUODENOSCOPY (EGD) WITH PROPOFOL N/A 06/26/2015   Procedure: ESOPHAGOGASTRODUODENOSCOPY (EGD) WITH PROPOFOL;  Surgeon: [name], MD;  Location: [facility] ENDOSCOPY;   Service: Endoscopy;  Laterality: N/A;  . HAND SURGERY    . hemmorrhoidectomy    . HERNIA REPAIR    . LITHOTRIPSY    . NEUROPLASTY / TRANSPOSITION MEDIAN NERVE AT CARPAL TUNNEL Left       Current Facility-Administered Medications:  .  lidocaine (PF) (XYLOCAINE) 1 % injection 10 mL, 10 mL, Other, Once, Marylene Land, NP  Current Outpatient Prescriptions:  .  amitriptyline (ELAVIL) 10 MG tablet, Take 10 mg by mouth at bedtime., Disp: , Rfl:  .  atorvastatin (LIPITOR) 80 MG tablet, Take 80 mg by mouth daily., Disp: , Rfl:  .  carbidopa-levodopa (SINEMET IR) 25-100 MG per tablet, Take 1 tablet by mouth 3 (three) times daily., Disp: , Rfl:  .  citalopram (CELEXA) 10 MG tablet, Take 10 mg by mouth daily., Disp: , Rfl:  .  cyanocobalamin 1000 MCG tablet, Take 100 mcg by mouth daily., Disp: , Rfl:  .  finasteride (PROSCAR) 5 MG tablet, Take 1 tablet (5 mg total) by mouth daily., Disp: 30 tablet, Rfl: 11 .  glucosamine-chondroitin 500-400 MG tablet, Take 1 tablet by mouth 3 (three) times daily., Disp: , Rfl:  .  lisinopril (PRINIVIL,ZESTRIL) 5 MG tablet, Take 5 mg by mouth daily., Disp: , Rfl:  .  metoprolol succinate (TOPROL-XL) 25 MG 24 hr tablet, Take 25 mg by mouth daily., Disp: , Rfl:  .  Multiple Vitamin (MULTIVITAMIN) tablet, Take 1 tablet by mouth daily., Disp: , Rfl:  .  omega-3 acid ethyl esters (LOVAZA) 1 G capsule, Take by mouth 2 (two) times daily., Disp: , Rfl:  .  pantoprazole (PROTONIX) 40 MG tablet, Take 40 mg by mouth daily., Disp: , Rfl:  .  rOPINIRole (REQUIP) 0.5 MG tablet, Take 0.5 mg by mouth 3 (three) times daily., Disp: , Rfl:  .  vitamin C (ASCORBIC ACID) 500 MG tablet, Take 500 mg by mouth daily., Disp: , Rfl:  .  cephALEXin (KEFLEX) 500 MG capsule, Take 1 capsule (500 mg total) by mouth 4 (four) times daily., Disp: 40 capsule, Rfl: 0 .  metaxalone (SKELAXIN) 800 MG tablet, Take 1 tablet (800 mg total) by mouth 3 (three) times daily., Disp: 30 tablet, Rfl: 0 .   mupirocin ointment (BACTROBAN) 2 %, Apply two times a day for 7 days., Disp: 22 g, Rfl: 0 .  nitroGLYCERIN (NITROSTAT) 0.4 MG SL tablet, Place 0.4 mg under the tongue every 5 (five) minutes as needed for chest pain., Disp: , Rfl:  .  tamsulosin (FLOMAX) 0.4 MG CAPS capsule, Take 1 capsule (0.4 mg total) by mouth daily. (Patient not taking: Reported on 07/19/2016), Disp: 30 capsule, Rfl: 0 .  triamcinolone (NASACORT AQ) 55 MCG/ACT AERO nasal inhaler, Place 2 sprays into the nose daily., Disp: , Rfl:   Allergies Penicillins and Tramadol  Family History  Problem Relation Age of Onset  . Asthma Mother   . Emphysema Mother   . Alzheimer's disease Father     Social History Social History  Substance Use Topics  . Smoking status: Never Smoker  . Smokeless tobacco: Former Systems developer    Quit date: 02/26/2007  .  Alcohol use No    Review of Systems Constitutional: No fever/chills Cardiovascular: Denies chest pain. Respiratory: Denies shortness of breath. Gastrointestinal: No abdominal pain.  Genitourinary: Negative for dysuria. Musculoskeletal: Negative for back pain. Skin: As above.   ____________________________________________   PHYSICAL EXAM:  VITAL SIGNS: ED Triage Vitals  Enc Vitals Group     BP 10/24/16 1335 (!) 120/97     Pulse Rate 10/24/16 1335 92     Resp 10/24/16 1335 16     Temp 10/24/16 1335 98.5 F (36.9 C)     Temp Source 10/24/16 1335 Oral     SpO2 10/24/16 1335 92 %     Weight --      Height --      Head Circumference --      Peak Flow --      Pain Score 10/24/16 1339 5     Pain Loc --      Pain Edu? --      Excl. in [place]? --     Constitutional: Alert and oriented. Well appearing and in no acute distress. Cardiovascular: Normal rate, regular rhythm. Grossly normal heart sounds.  Good peripheral circulation. Respiratory: Normal respiratory effort without tachypnea nor retractions. Breath sounds are clear and equal bilaterally. No wheezes, rales,  rhonchi. Musculoskeletal: See skin below. Neurologic:  Normal speech and language.  Speech is normal. No gait instability. Tremor noted. Skin:  Skin is warm, dry. Except: Right thumb diffuse swelling, laceration extended from mid proximal nail bed and cuticle margin wrapping around to mid fat pad with approximate 0.5 cm separation of wound margins with subcutaneous tissue protruding from wound, diffuse distal phalanx tenderness, normal distal sensation and capillary refill, good strength with resisted distal flexion but with mild pain, also good strength with resisted distal extension with minimal pain, range of motion limited with diffuse swelling, right hand otherwise nontender. No bone or tendon visualized. No foreign bodies visualized, dirt beneath the nail noted.  Psychiatric: Mood and affect are normal. Speech and behavior are normal. Patient exhibits appropriate insight and judgment   ___________________________________________   LABS (all labs ordered are listed, but only abnormal results are displayed)  Labs Reviewed - No data to display ____________________________________________  RADIOLOGY  Dg Finger Thumb Right  Result Date: 10/24/2016 CLINICAL DATA:  Right distal thumb laceration following a welding tool injury yesterday. EXAM: RIGHT THUMB 2+V COMPARISON:  None. FINDINGS: Small soft tissue defect in the ventral aspect of the distal thumb. There multiple small underlying bone fragments as well as a longitudinal fracture in the ulnar aspect of the first distal phalanx with mild ulnar displacement of the largest fragment. Moderate dorsal spur formation is noting in involving the first IP joint with mild to moderate spur formation involving the first MCP joint. A small dorsal soft tissue protrusion is noted at the base of the first finger nail. There are also 2 tiny calcific densities at the medial and lateral aspects of the distal portion of the first finger nail. IMPRESSION: 1. Small  soft tissue laceration in the distal aspect of the right thumb with an underlying mildly comminuted fracture of the first distal phalanx. 2. Two tiny calcific densities at the distal aspect of the first finger nail. These most likely represent debris beneath the nail rather than foreign bodies. 3. Degenerative changes involving the first IP joint and first MCP joint. Electronically Signed   By: Claudie Revering M.D.   On: 10/24/2016 14:26   ____________________________________________   PROCEDURES Procedures  Procedure(s) performed:  Procedure explained and verbal consent obtained. Consent: Verbal consent obtained. Written consent not obtained. Risks and benefits: risks, benefits and alternatives were discussed including infection, decreased range of motion and other complications. Patient identity confirmed: verbally with patient and hospital-assigned identification number  Consent given by: patient   Laceration Repair Location: right thumb Length: 5.25 Foreign bodies: no foreign bodies visualized Tendon involvement: none noted, none seen Nerve involvement: none Preparation: Patient was prepped and draped in the usual sterile fashion. Anesthesia with 1% Lidocaine 6 mls Irrigation solution: sterile water and betadine Irrigation method: jet lavage Amount of cleaning: copious Jagged wound margins, wound edges revised Repaired with 4-0 nylong Number of sutures: 3 Repaired with 4-0 nylon  Number of sutures: 1 Technique: simple interrupted  Approximation: loose Patient tolerate well. Wound approximated post repair.  Antibiotic ointment and dressing applied.  Wound care instructions provided.  Observe for any signs of infection or other problems.   Finger splint applied.     INITIAL IMPRESSION / ASSESSMENT AND PLAN / ED COURSE  Pertinent labs & imaging results that were available during my care of the patient were reviewed by me and considered in my medical decision making (see chart  for details).  Patient presented with right thumb crush injury and laceration that occurred greater than 12 hours prior to arrival. Will obtain x-ray, concern for bony abnormality. Wound extends around thumb from cuticle bed to fat pad is slightly gaping. Plan to loosely tack wound with Steri-Strips or sutures. Obtain x-ray. Tetanus immunization updated.  Right thumb x-ray reviewed by myself and radiologist, soft tissue laceration with underlying mildly comminuted fracture of the first distal phalanx, degenerative changes as well as tiny calcific densities at distal aspect of the first finger nail. Called and discussed these findings with Dr. Roland Rack orthopedic on call who also reviewed patient's x-rays. Orthopedics recommends copiously cleaning and irrigating, approximating wound loosely and outpatient repeat follow-up in 2-3 days. Discussed this with patient and wife who agrees with this plan.  Patient wound was copiously cleaned and irrigated. RN had to assist in stabilizing hand due to Parkinson's. Wound was loosely approximated with 4 sutures and one Steri-Strip. Patient did have laceration to proximal nail, and discussed with patient removal of nail and surrounding nail bed then suturing now, patient declined, one suture placed directly through nail after removing subcutaneous tissue, patient tolerated this well with improved wound approximation post procedure. Wound was then copiously irrigated and finger splint applied. Patient to have 2-3 day follow-up with orthopedic for wound check and follow-up. Patient states that he is penicillin allergic, however states that he thinks that he can tolerate cephalexin, discussed in detail with patient regarding, and will treat patient with oral Keflex for open fracture as well as laceration open greater than 12 hours. Discussed very strict follow-up and return parameters. Also topical Bactroban. Discussed cleaning and wound care at home.Discussed indication, risks  and benefits of medications with patient.  Discussed follow up with Primary care physician this week. Discussed follow up and return parameters including no resolution or any worsening concerns. Patient verbalized understanding and agreed to plan.   ____________________________________________   FINAL CLINICAL IMPRESSION(S) / ED DIAGNOSES  Final diagnoses:  Laceration of right thumb with damage to nail, foreign body presence unspecified, initial encounter  Displaced fracture of distal phalanx of right thumb, initial encounter for open fracture     Discharge Medication List as of 10/24/2016  4:15 PM    START taking these medications  Details  cephALEXin (KEFLEX) 500 MG capsule Take 1 capsule (500 mg total) by mouth 4 (four) times daily., Starting Mon 10/24/2016, Until Thu 11/03/2016, Normal        Note: This dictation was prepared with Dragon dictation along with smaller phrase technology. Any transcriptional errors that result from this process are unintentional.         Marylene Land, NP 10/24/16 1722

## 2016-10-24 NOTE — Discharge Instructions (Signed)
Take medication as prescribed. Rest. Drink plenty of fluids. Keep clean as discussed. Keep in splint. Keep clean.   Follow up with orthopedic as discussed. Call tomorrow. Follow up in 2-3 days.   Sutures to be removed in 7-10 days.  Follow up with your primary care physician this week as needed. Return to Urgent care for new or worsening concerns.

## 2016-10-24 NOTE — ED Triage Notes (Signed)
Patient lacerated his right thumb yesterday when he caught it in a clamp. The laceration is closed, but swelling is visible.

## 2016-11-24 ENCOUNTER — Other Ambulatory Visit: Payer: Self-pay

## 2016-11-24 DIAGNOSIS — N401 Enlarged prostate with lower urinary tract symptoms: Secondary | ICD-10-CM

## 2016-11-24 MED ORDER — FINASTERIDE 5 MG PO TABS
5.0000 mg | ORAL_TABLET | Freq: Every day | ORAL | 0 refills | Status: AC
Start: 2016-11-24 — End: 2017-11-24

## 2017-02-07 ENCOUNTER — Ambulatory Visit
Admission: RE | Admit: 2017-02-07 | Discharge: 2017-02-07 | Disposition: A | Discharge status: Home / Self Care | Payer: Medicare Other | Source: Ambulatory Visit | Attending: Urology | Admitting: Urology

## 2017-02-07 DIAGNOSIS — R1900 Intra-abdominal and pelvic swelling, mass and lump, unspecified site: Secondary | ICD-10-CM | POA: Insufficient documentation

## 2017-02-07 MED ORDER — GADOBENATE DIMEGLUMINE 529 MG/ML IV SOLN
15.0000 mL | Freq: Once | INTRAVENOUS | Status: AC | PRN
  Administered 2017-02-07: 15 mL via INTRAVENOUS

## 2017-02-10 ENCOUNTER — Encounter: Payer: Self-pay | Admitting: Urology

## 2017-02-10 ENCOUNTER — Ambulatory Visit (INDEPENDENT_AMBULATORY_CARE_PROVIDER_SITE_OTHER): Payer: Medicare Other | Admitting: Urology

## 2017-02-10 VITALS — BP 115/71 | HR 61 | Ht 65.0 in | Wt 167.0 lb

## 2017-02-10 DIAGNOSIS — Z87898 Personal history of other specified conditions: Secondary | ICD-10-CM

## 2017-02-10 DIAGNOSIS — Z87442 Personal history of urinary calculi: Secondary | ICD-10-CM

## 2017-02-10 DIAGNOSIS — N138 Other obstructive and reflux uropathy: Secondary | ICD-10-CM | POA: Diagnosis not present

## 2017-02-10 DIAGNOSIS — R1900 Intra-abdominal and pelvic swelling, mass and lump, unspecified site: Secondary | ICD-10-CM

## 2017-02-10 DIAGNOSIS — N401 Enlarged prostate with lower urinary tract symptoms: Secondary | ICD-10-CM

## 2017-02-10 NOTE — Progress Notes (Signed)
02/10/2017 10:36 AM   Christian Thompson Nov 16, 1950 [id number]  Referring provider: Katheren Thompson [address] [place], [place] [address]  Chief Complaint  Patient presents with  . Results    MRI    HPI: 66 year old male with Parkinson's who returns today for routine urologic follow-up.  Most recently, he underwent repeat pelvic mass and primarily returns to discuss these results.    History of nephrolithiasis CT abdomen and pelvis showed completed on 05/28/2015 noted 2 left distal ureteral stones measuring up to 4 mm each. Spontaneous passage of stones.   RUS completed on 06/22/2015 noted resolution of the left-sided hydronephrosis and a right renal cyst.   Stone composition is unknown.  KUB taken on 06/20/2016 noted no nephrolithiasis.  No recent flank pain or stone episodes.    History of elevated PSA/ possible prostate cancer 07/24/2012  2.1 08/2012 prostate biopsy - small focus of glands suspicious for prostate cancer, right lateral base 07/24/2013  2.38 01/20/14  2.28 5/17 2.9 05/2016 2.58  --> started finasteride shortly thereafter  Most recent rectal exam 06/2016 enlarged but otherwise unremarkable  BPH/ LUTS/ history of bladder stones S/p via cystolitholapaxy by [name]  Currently on finasteride but ran out of flomax- did not notice difference   Overall, please with symptoms today.  No urgency/ frequency.  No longer gets up at night.  Good stream except for first void of morning.    History of gross hematuria S/p work up 06/2016  No further episodes of gross blood  Pelvic mass Incidental finding of increasingly conspicuous predominantly fatty attenuation lesion measuring 6.6 x 4.9 x 5.7 cm in the right hemipelvis extending through the right obturator foramen on 5/218.  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    Interval 6 month MR shows stable lesion as of 02/07/2017.    Denies pelvic pain.     PMH  significant for parkinson's, CAD     PMH: Past Medical History:  Diagnosis Date  . Acid reflux 03/07/2011  . Arteriosclerosis of coronary artery 03/07/2011   Overview:  Non ST-elevation myocardial infarction.  Status post coronary artery bypass grafting x4 with left internal mammary artery to left anterior descending artery, saphenous vein graft to posterior descending artery, obtuse marginal 2 and obtuse marginal 1 individually on 02/10/2011, by [facility].   . Bilateral carpal tunnel syndrome   . Carpal tunnel syndrome 03/26/2014  . Coronary artery disease   . Diverticulosis   . Dystonia   . Erosive esophagitis   . 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    [name] GERD (gastroesophageal reflux disease)   . History of hiatal hernia   . HLD (hyperlipidemia) 04/29/2011   Overview:  Formatting of this note may be different from the original. Component     Latest Ref Rng 02/10/2011 05/02/2011  Cholesterol, Total      171 114  Triglyceride      64 111  HDL Cholesterol      46 50  Low Density Lipoprotein      105 42    . Hyperlipidemia   . Hypertension   . Lipids serum increased   . Parkinson disease ([place])   . Parkinson's disease ([place]) 03/07/2011  . Renal disorder   . Restless leg 03/21/2012  . RLS (restless legs syndrome)   . RLS (restless legs syndrome)     Surgical History: Past Surgical History:  Procedure  Laterality Date  . CARDIAC CATHETERIZATION    . COLONOSCOPY WITH PROPOFOL N/A 09/29/2014   Procedure: COLONOSCOPY WITH PROPOFOL;  Surgeon: [name], MD;  Location[facility] ENDOSCOPY;  Service: Endoscopy;  Laterality: N/A;  . CORONARY ARTERY BYPASS GRAFT    . ESOPHAGOGASTRODUODENOSCOPY (EGD) WITH PROPOFOL N/A 09/29/2014   Procedure: ESOPHAGOGASTRODUODENOSCOPY (EGD) WITH PROPOFOL;  Surgeon: [name], MD;  Location: [facility] ENDOSCOPY;  Service: Endoscopy;  Laterality: N/A;  . ESOPHAGOGASTRODUODENOSCOPY (EGD) WITH PROPOFOL N/A 06/26/2015    Procedure: ESOPHAGOGASTRODUODENOSCOPY (EGD) WITH PROPOFOL;  Surgeon: [name], MD;  Location: [facility] ENDOSCOPY;  Service: Endoscopy;  Laterality: N/A;  . HAND SURGERY    . hemmorrhoidectomy    . HERNIA REPAIR    . LITHOTRIPSY    . NEUROPLASTY / TRANSPOSITION MEDIAN NERVE AT CARPAL TUNNEL Left     Home Medications:  Allergies as of 02/10/2017      Reactions   Penicillins    Tramadol       Medication List        Accurate as of 02/10/17 10:36 AM. Always use your most recent med list.          amitriptyline 10 MG tablet Commonly known as:  ELAVIL Take 10 mg by mouth at bedtime.   atorvastatin 80 MG tablet Commonly known as:  LIPITOR Take 80 mg by mouth daily.   carbidopa-levodopa 25-100 MG tablet Commonly known as:  SINEMET IR Take 1 tablet by mouth 3 (three) times daily.   citalopram 10 MG tablet Commonly known as:  CELEXA Take 10 mg by mouth daily.   cyanocobalamin 1000 MCG tablet Take 100 mcg by mouth daily.   finasteride 5 MG tablet Commonly known as:  PROSCAR Take 1 tablet (5 mg total) by mouth daily.   glucosamine-chondroitin 500-400 MG tablet Take 1 tablet by mouth 3 (three) times daily.   lisinopril 5 MG tablet Commonly known as:  PRINIVIL,ZESTRIL Take 5 mg by mouth daily.   metoprolol succinate 25 MG 24 hr tablet Commonly known as:  TOPROL-XL Take 25 mg by mouth daily.   multivitamin tablet Take 1 tablet by mouth daily.   mupirocin ointment 2 % Commonly known as:  BACTROBAN Apply two times a day for 7 days.   NASACORT AQ 55 MCG/ACT Aero nasal inhaler Generic drug:  triamcinolone Place 2 sprays into the nose daily.   nitroGLYCERIN 0.4 MG SL tablet Commonly known as:  NITROSTAT Place 0.4 mg under the tongue every 5 (five) minutes as needed for chest pain.   omega-3 acid ethyl esters 1 g capsule Commonly known as:  LOVAZA Take by mouth 2 (two) times daily.   pantoprazole 40 MG tablet Commonly known as:  PROTONIX Take 40 mg by  mouth daily.   rOPINIRole 0.5 MG tablet Commonly known as:  REQUIP Take 0.5 mg by mouth 3 (three) times daily.   tamsulosin 0.4 MG Caps capsule Commonly known as:  FLOMAX Take 1 capsule (0.4 mg total) by mouth daily.   vitamin C 500 MG tablet Commonly known as:  ASCORBIC ACID Take 500 mg by mouth daily.       Allergies:  Allergies  Allergen Reactions  . Penicillins   . Tramadol     Family History: Family History  Problem Relation Age of Onset  . Asthma Mother   . Emphysema Mother   . Alzheimer's disease Father     Social History:  reports that  has never smoked. He quit smokeless tobacco use about 9  years ago. He reports that he does not drink alcohol or use drugs.  ROS: UROLOGY Frequent Urination?: No Hard to postpone urination?: No Burning/pain with urination?: No Get up at night to urinate?: No Leakage of urine?: No Urine stream starts and stops?: No Trouble starting stream?: No Do you have to strain to urinate?: No Blood in urine?: No Urinary tract infection?: No Sexually transmitted disease?: No Injury to kidneys or bladder?: No Painful intercourse?: No Weak stream?: No Erection problems?: No Penile pain?: No  Gastrointestinal Nausea?: No Vomiting?: No Indigestion/heartburn?: No Diarrhea?: No Constipation?: No  Constitutional Fever: No Night sweats?: No Weight loss?: No Fatigue?: No  Skin Skin rash/lesions?: No Itching?: No  Eyes Blurred vision?: No Double vision?: No  Ears/Nose/Throat Sore throat?: No Sinus problems?: No  Hematologic/Lymphatic Swollen glands?: No Easy bruising?: No  Cardiovascular Leg swelling?: No Chest pain?: No  Respiratory Cough?: No Shortness of breath?: No  Endocrine Excessive thirst?: No  Musculoskeletal Back pain?: No Joint pain?: No  Neurological Headaches?: No Dizziness?: No  Psychologic Depression?: No Anxiety?: No  Physical Exam: BP 115/71   Pulse 61   Ht 5\' 5"  (1.651 m)    Wt 167 lb (75.8 kg)   BMI 27.79 kg/m   Constitutional:  Alert and oriented, No acute distress.  Accompanied by wife today.   HEENT: [place] AT, moist mucus membranes.  Trachea midline, no masses. Cardiovascular: No clubbing, cyanosis, or edema. Respiratory: Normal respiratory effort, no increased work of breathing. Skin: No rashes, bruises or suspicious lesions. Neurologic: Grossly intact, no focal deficits, moving all 4 extremities.  Left hand tremor noted.   Psychiatric: Normal mood and affect.  Laboratory Data: Lab Results  Component Value Date   WBC 12.1 (H) 05/28/2015   HGB 14.8 05/28/2015   HCT 43.8 05/28/2015   MCV 87.5 05/28/2015   PLT 150 05/28/2015    Lab Results  Component Value Date   CREATININE 1.03 06/27/2016    Lab Results  Component Value Date   PSA1 2.9 06/25/2015     Urinalysis Lab Results  Component Value Date   SPECGRAV >1.030 (H) 07/19/2016   PHUR 5.5 07/19/2016   COLORU Yellow 07/19/2016   APPEARANCEUR Clear 07/19/2016   LEUKOCYTESUR Negative 07/19/2016   PROTEINUR Negative 07/19/2016   GLUCOSEU Negative 07/19/2016   KETONESU Trace (A) 07/19/2016   RBCU Negative 07/19/2016   BILIRUBINUR Negative 07/19/2016   UUROB 0.2 07/19/2016   NITRITE Negative 07/19/2016    Lab Results  Component Value Date   LABMICR See below: 07/19/2016   WBCUA 0-5 07/19/2016   RBCUA None seen 07/19/2016   LABEPIT 0-10 07/19/2016   MUCUS Present (A) 07/19/2016   BACTERIA Few (A) 07/19/2016    Pertinent Imaging: CLINICAL DATA:  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 Subsequent encounter. The patient reports the lesion is enlarging.  EXAM: MRI PELVIS WITHOUT AND WITH CONTRAST  TECHNIQUE: Multiplanar multisequence MR imaging of the pelvis was performed both before and after administration of intravenous contrast.  CONTRAST:  15 ml MULTIHANCE GADOBENATE DIMEGLUMINE 529 MG/ML IV SOLN  COMPARISON:  MRI of the pelvis  07/28/2016.  FINDINGS: T1 hyperintense mass in the lower right pelvis is again seen. As on the prior examination, the lesion extends through the right obturator foramen into the obturator externus. Medial to the foramen it is unchanged in size measuring 6.5 cm AP by 5.5 cm craniocaudal by 4.6 cm oblique transverse. Along the lateral margin of the right obturator  foramen, the lesion is 5.4 cm AP by 2.4 cm transverse by 2.3 cm craniocaudal, also unchanged. Please note this is a re- measurement on the prior study. Overall, the lesion is 6.5 cm oblique AP by 6.3 cm oblique transverse by up to 5.8 cm craniocaudal as measured on image 26 of series 3 and 18 of series 5. A few fine septations are seen within the lesion, unchanged. No thickened septation or nodule within the lesion is identified. Fibers of muscle running through the lesion are again seen, unchanged. No new mass is identified. No bone marrow signal abnormality is seen.  IMPRESSION: Fatty tumor in the right pelvis extending through the right obturator foramen is unchanged in size and appearance since the most recent examination. 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 Surveillance in 1 year to ensure stability is recommended unless the lesion becomes painful or enlarges in size.   Electronically Signed   By: Inge Rise M.D.   On: 02/07/2017 10:27  MRI personally reviewed  Assessment & Plan:    1. 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 History of elevated PSA PSA stability low Will continue to follow PSA in 1 year with rectal- should be lower since starting finasteride  3. History of nephrolithiasis Asymptomatic with no stones on last KUB We will follow clinically  4. BPH with obstruction/lower urinary tract  symptoms Continue finasteride Stop Flomax as there has been no significant change since coming off of this medication   Return in about 1 year (around 02/10/2018) for MRI/ PSA/ DRE/ IPSS.  Hollice Espy, MD  [facility] [address] [place], [place] [address] [phone]  I spent 25 min with this patient of which greater than 50% was spent in counseling and coordination of care with the patient.

## 2017-12-22 ENCOUNTER — Encounter: Payer: Self-pay | Admitting: *Deleted

## 2017-12-25 ENCOUNTER — Encounter: Payer: Self-pay | Admitting: Anesthesiology

## 2017-12-25 ENCOUNTER — Ambulatory Visit: Payer: Medicare Other | Admitting: Anesthesiology

## 2017-12-25 ENCOUNTER — Encounter
Admission: RE | Disposition: A | Discharge status: Home / Self Care | Payer: Self-pay | Source: Ambulatory Visit | Attending: Gastroenterology

## 2017-12-25 ENCOUNTER — Ambulatory Visit
Admission: RE | Admit: 2017-12-25 | Discharge: 2017-12-25 | Disposition: A | Discharge status: Home / Self Care | Payer: Medicare Other | Source: Ambulatory Visit | Attending: Gastroenterology | Admitting: Gastroenterology

## 2017-12-25 DIAGNOSIS — K621 Rectal polyp: Secondary | ICD-10-CM | POA: Diagnosis not present

## 2017-12-25 DIAGNOSIS — G20 Parkinson's disease: Secondary | ICD-10-CM | POA: Insufficient documentation

## 2017-12-25 DIAGNOSIS — K219 Gastro-esophageal reflux disease without esophagitis: Secondary | ICD-10-CM | POA: Diagnosis not present

## 2017-12-25 DIAGNOSIS — E785 Hyperlipidemia, unspecified: Secondary | ICD-10-CM | POA: Insufficient documentation

## 2017-12-25 DIAGNOSIS — Z888 Allergy status to other drugs, medicaments and biological substances status: Secondary | ICD-10-CM | POA: Diagnosis not present

## 2017-12-25 DIAGNOSIS — I2510 Atherosclerotic heart disease of native coronary artery without angina pectoris: Secondary | ICD-10-CM | POA: Diagnosis not present

## 2017-12-25 DIAGNOSIS — I252 Old myocardial infarction: Secondary | ICD-10-CM | POA: Diagnosis not present

## 2017-12-25 DIAGNOSIS — Z87442 Personal history of urinary calculi: Secondary | ICD-10-CM | POA: Insufficient documentation

## 2017-12-25 DIAGNOSIS — D1779 Benign lipomatous neoplasm of other sites: Secondary | ICD-10-CM | POA: Diagnosis not present

## 2017-12-25 DIAGNOSIS — K529 Noninfective gastroenteritis and colitis, unspecified: Secondary | ICD-10-CM | POA: Insufficient documentation

## 2017-12-25 DIAGNOSIS — Z79899 Other long term (current) drug therapy: Secondary | ICD-10-CM | POA: Insufficient documentation

## 2017-12-25 DIAGNOSIS — Z86010 Personal history of colonic polyps: Secondary | ICD-10-CM | POA: Diagnosis present

## 2017-12-25 DIAGNOSIS — K6389 Other specified diseases of intestine: Secondary | ICD-10-CM | POA: Insufficient documentation

## 2017-12-25 DIAGNOSIS — I10 Essential (primary) hypertension: Secondary | ICD-10-CM | POA: Insufficient documentation

## 2017-12-25 DIAGNOSIS — N400 Enlarged prostate without lower urinary tract symptoms: Secondary | ICD-10-CM | POA: Diagnosis not present

## 2017-12-25 DIAGNOSIS — G2581 Restless legs syndrome: Secondary | ICD-10-CM | POA: Diagnosis not present

## 2017-12-25 DIAGNOSIS — Z7982 Long term (current) use of aspirin: Secondary | ICD-10-CM | POA: Insufficient documentation

## 2017-12-25 DIAGNOSIS — Z951 Presence of aortocoronary bypass graft: Secondary | ICD-10-CM | POA: Diagnosis not present

## 2017-12-25 DIAGNOSIS — D120 Benign neoplasm of cecum: Secondary | ICD-10-CM | POA: Diagnosis not present

## 2017-12-25 DIAGNOSIS — Z880 Allergy status to penicillin: Secondary | ICD-10-CM | POA: Diagnosis not present

## 2017-12-25 HISTORY — DX: Benign prostatic hyperplasia without lower urinary tract symptoms: N40.0

## 2017-12-25 HISTORY — PX: COLONOSCOPY WITH PROPOFOL: SHX5780

## 2017-12-25 HISTORY — DX: Impaired glucose tolerance (oral): R73.02

## 2017-12-25 HISTORY — DX: Personal history of urinary calculi: Z87.442

## 2017-12-25 SURGERY — COLONOSCOPY WITH PROPOFOL
Anesthesia: General

## 2017-12-25 MED ORDER — EPHEDRINE SULFATE 50 MG/ML IJ SOLN
INTRAMUSCULAR | Status: DC | PRN
  Administered 2017-12-25 (×3): 5 mg via INTRAVENOUS

## 2017-12-25 MED ORDER — PROPOFOL 500 MG/50ML IV EMUL
INTRAVENOUS | Status: DC | PRN
  Administered 2017-12-25: 120 ug/kg/min via INTRAVENOUS

## 2017-12-25 MED ORDER — MIDAZOLAM HCL 2 MG/2ML IJ SOLN
INTRAMUSCULAR | Status: AC
  Filled 2017-12-25: qty 2

## 2017-12-25 MED ORDER — PROPOFOL 500 MG/50ML IV EMUL
INTRAVENOUS | Status: AC
  Filled 2017-12-25: qty 50

## 2017-12-25 MED ORDER — LIDOCAINE HCL (PF) 1 % IJ SOLN
INTRAMUSCULAR | Status: AC
  Administered 2017-12-25: 0.3 mL via INTRADERMAL
  Filled 2017-12-25: qty 2

## 2017-12-25 MED ORDER — FENTANYL CITRATE (PF) 100 MCG/2ML IJ SOLN
INTRAMUSCULAR | Status: AC
  Filled 2017-12-25: qty 2

## 2017-12-25 MED ORDER — FENTANYL CITRATE (PF) 100 MCG/2ML IJ SOLN
INTRAMUSCULAR | Status: DC | PRN
  Administered 2017-12-25: 25 ug via INTRAVENOUS
  Administered 2017-12-25: 50 ug via INTRAVENOUS
  Administered 2017-12-25: 25 ug via INTRAVENOUS

## 2017-12-25 MED ORDER — MIDAZOLAM HCL 2 MG/2ML IJ SOLN
INTRAMUSCULAR | Status: DC | PRN
  Administered 2017-12-25: 2 mg via INTRAVENOUS

## 2017-12-25 MED ORDER — SODIUM CHLORIDE 0.9 % IV SOLN
INTRAVENOUS | Status: DC
  Administered 2017-12-25: 1000 mL via INTRAVENOUS

## 2017-12-25 MED ORDER — LIDOCAINE HCL (PF) 1 % IJ SOLN
2.0000 mL | Freq: Once | INTRAMUSCULAR | Status: AC
  Administered 2017-12-25: 0.3 mL via INTRADERMAL

## 2017-12-25 NOTE — Transfer of Care (Signed)
Immediate Anesthesia Transfer of Care Note  Patient: Christian Thompson  Procedure(s) Performed: COLONOSCOPY WITH PROPOFOL (N/A )  Patient Location: PACU  Anesthesia Type:General  Level of Consciousness: awake and sedated  Airway & Oxygen Therapy: Patient Spontanous Breathing and Patient connected to nasal cannula oxygen  Post-op Assessment: Report given to RN and Post -op Vital signs reviewed and stable  Post vital signs: Reviewed  Last Vitals:  Vitals Value Taken Time  BP    Temp    Pulse    Resp    SpO2      Last Pain:  Vitals:   12/25/17 1221  TempSrc: Tympanic  PainSc: 0-No pain         Complications: No apparent anesthesia complications

## 2017-12-25 NOTE — Anesthesia Postprocedure Evaluation (Signed)
Anesthesia Post Note  Patient: Christian Thompson  Procedure(s) Performed: COLONOSCOPY WITH PROPOFOL (N/A )  Patient location during evaluation: Endoscopy Anesthesia Type: General Level of consciousness: awake and alert Pain management: pain level controlled Vital Signs Assessment: post-procedure vital signs reviewed and stable Respiratory status: spontaneous breathing, nonlabored ventilation, respiratory function stable and patient connected to nasal cannula oxygen Cardiovascular status: blood pressure returned to baseline and stable Postop Assessment: no apparent nausea or vomiting Anesthetic complications: no     Last Vitals:  Vitals:   12/25/17 1412 12/25/17 1422  BP: 97/63 118/75  Pulse: (!) 49 (!) 50  Resp: 17 16  Temp:    SpO2: 98% 100%    Last Pain:  Vitals:   12/25/17 1422  TempSrc:   PainSc: 0-No pain                 Precious Haws Piscitello

## 2017-12-25 NOTE — Anesthesia Preprocedure Evaluation (Signed)
Anesthesia Evaluation  Patient identified by MRN, date of birth, ID band Patient awake    Reviewed: Allergy & Precautions, H&P , NPO status , Patient's Chart, lab work & pertinent test results  History of Anesthesia Complications Negative for: history of anesthetic complications  Airway Mallampati: III  TM Distance: <3 FB Neck ROM: full    Dental  (+) Chipped   Pulmonary neg pulmonary ROS, neg shortness of breath,           Cardiovascular Exercise Tolerance: Good hypertension, (-) angina+ CAD  (-) Past MI and (-) DOE      Neuro/Psych  Neuromuscular disease negative psych ROS   GI/Hepatic Neg liver ROS, hiatal hernia, PUD, GERD  Medicated and Controlled,  Endo/Other  negative endocrine ROS  Renal/GU Renal disease  negative genitourinary   Musculoskeletal   Abdominal   Peds  Hematology negative hematology ROS (+)   Anesthesia Other Findings Past Medical History: 03/07/2011: Acid reflux 03/07/2011: Arteriosclerosis of coronary artery     Comment:  Overview:  Non ST-elevation myocardial infarction.                Status post coronary artery bypass grafting x4 with left               internal mammary artery to left anterior descending               artery, saphenous vein graft to posterior descending               artery, obtuse marginal 2 and obtuse marginal 1               individually on 02/10/2011, by [facility].  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   No date: GERD (gastroesophageal reflux disease) No date: History of hiatal hernia No date: History of kidney stones 04/29/2011: HLD (hyperlipidemia)     Comment:  Overview:   Formatting of this note may be different from              the original. Component     Latest Ref Rng 02/10/2011               05/02/2011  Cholesterol, Total      171 114  Triglyceride               64 111  HDL Cholesterol      46 50  Low Density               Lipoprotein      105 42   No date: Hyperlipidemia No date: Hypertension No date: IGT (impaired glucose tolerance) No date: Lipids serum increased No date: Parkinson disease ([place]) 03/07/2011: Parkinson's disease ([place]) No date: Renal disorder 03/21/2012: Restless leg No date: RLS (restless legs syndrome) No date: RLS (restless legs syndrome)  Past Surgical History: No date: CARDIAC CATHETERIZATION 09/29/2014: COLONOSCOPY WITH PROPOFOL; N/A     Comment:  Procedure: COLONOSCOPY WITH PROPOFOL;  Surgeon: [name], MD;  Location: [facility] ENDOSCOPY;  Service:  Endoscopy;  Laterality: N/A; No date: CORONARY ARTERY BYPASS GRAFT 09/29/2014: ESOPHAGOGASTRODUODENOSCOPY (EGD) WITH PROPOFOL; N/A     Comment:  Procedure: ESOPHAGOGASTRODUODENOSCOPY (EGD) WITH               PROPOFOL;  Surgeon: [name], MD;  Location:               [facility] ENDOSCOPY;  Service: Endoscopy;  Laterality: N/A; 06/26/2015: ESOPHAGOGASTRODUODENOSCOPY (EGD) WITH PROPOFOL; N/A     Comment:  Procedure: ESOPHAGOGASTRODUODENOSCOPY (EGD) WITH               PROPOFOL;  Surgeon: [name], MD;  Location:               [facility] ENDOSCOPY;  Service: Endoscopy;  Laterality: N/A; No date: HAND SURGERY No date: hemmorrhoidectomy No date: HERNIA REPAIR No date: LITHOTRIPSY No date: NEUROPLASTY / TRANSPOSITION MEDIAN NERVE AT CARPAL TUNNEL;  Left  BMI    Body Mass Index:  27.81 kg/m      Reproductive/Obstetrics negative OB ROS                             Anesthesia Physical Anesthesia Plan  ASA: III  Anesthesia Plan: General   Post-op Pain Management:    Induction: Intravenous  PONV Risk Score and Plan:  Propofol infusion and TIVA  Airway Management Planned: Natural Airway and Nasal Cannula  Additional Equipment:   Intra-op Plan:   Post-operative Plan:   Informed Consent: I have reviewed the patients History and Physical, chart, labs and discussed the procedure including the risks, benefits and alternatives for the proposed anesthesia with the patient or authorized representative who has indicated his/her understanding and acceptance.   Dental Advisory Given  Plan Discussed with: Anesthesiologist, CRNA and Surgeon  Anesthesia Plan Comments: (Patient consented for risks of anesthesia including but not limited to:  - adverse reactions to medications - risk of intubation if required - damage to teeth, lips or other oral mucosa - sore throat or hoarseness - Damage to heart, brain, lungs or loss of life  Patient voiced understanding.)        Anesthesia Quick Evaluation

## 2017-12-25 NOTE — Anesthesia Post-op Follow-up Note (Signed)
Anesthesia QCDR form completed.        

## 2017-12-25 NOTE — Op Note (Addendum)
[facility] Gastroenterology Patient Name: Christian Thompson Procedure Date: 12/25/2017 1:17 PM MRN: [id number] Account #: [id number] Date of Birth: Apr 22, 1950 Admit Type: Outpatient Age: 67 Room: [facility] ENDO ROOM 3 Gender: Male Note Status: Finalized Procedure:            Colonoscopy Indications:          Personal history of colonic polyps Providers:            [name], MD Referring MD:         No Local Md, MD (Referring MD) Medicines:            Monitored Anesthesia Care Complications:        No immediate complications. Procedure:            Pre-Anesthesia Assessment:                       - ASA Grade Assessment: III - A patient with severe                        systemic disease.                       After obtaining informed consent, the colonoscope was                        passed under direct vision. Throughout the procedure,                        the patient's blood pressure, pulse, and oxygen                        saturations were monitored continuously. The                        Colonoscope was introduced through the anus and                        advanced to the the cecum, identified by appendiceal                        orifice and ileocecal valve. The colonoscopy was                        performed without difficulty. The patient tolerated the                        procedure well. The quality of the bowel preparation                        was fair. Findings:      A 2 mm polyp was found in the rectum. The polyp was sessile. The polyp       was removed with a cold biopsy forceps. Resection and retrieval were       complete.      A 1 mm polyp was found in the cecum. The polyp was sessile. The polyp       was removed with a cold biopsy forceps. Resection and retrieval were       complete.      A patchy area of mildly erythematous mucosa was found at 65 cm proximal  to the anus. Biopsies were taken with a cold forceps for histology.        Biopsies for histology were taken with a cold forceps for evaluation of       microscopic colitis. Biopsies were taken with a cold forceps for       histology from a normal area of mucosa at about 60 cm frmothe anal verge       and placed in a separate jar.      An area of extrinsic compression is noted in the right side of the       rectum in the mid to distal rectal vault to both DRE and scope       appearance. There is no abnormality noted otherwise.      Multiple medium and small-mouthed diverticula were found in the sigmoid       colon and descending colon. Impression:           - Preparation of the colon was fair.                       - One 2 mm polyp in the rectum, removed with a cold                        biopsy forceps. Resected and retrieved.                       - One 1 mm polyp in the cecum, removed with a cold                        biopsy forceps. Resected and retrieved.                       - Erythematous mucosa at 65 cm proximal to the anus.                        Biopsied.                       - Atypical extrinsic compression in right rectum                        consistant with known lipoma, followed by urology. Recommendation:       - Discharge patient to home.                       - Await pathology results.                       - Return to GI clinic in 1 week.                       - Await pathology results. Procedure Code(s):    --- Professional ---                       [phone], Colonoscopy, flexible; with biopsy, single or                        multiple Diagnosis Code(s):    --- Professional ---                       K62.1, Rectal polyp  D12.0, Benign neoplasm of cecum                       K63.89, Other specified diseases of intestine                       Z86.010, Personal history of colonic polyps CPT copyright 2018 [employer] Association. All rights reserved. The codes documented in this report are preliminary and upon  coder review may  be revised to meet current compliance requirements. [name], MD 12/25/2017 1:57:44 PM This report has been signed electronically. Number of Addenda: 0 Note Initiated On: 12/25/2017 1:17 PM Scope Withdrawal Time: 0 hours 8 minutes 23 seconds  Total Procedure Duration: 0 hours 21 minutes 11 seconds       [facility]

## 2017-12-25 NOTE — Anesthesia Procedure Notes (Signed)
Performed by: Cook-Martin, Shiquan Mathieu Pre-anesthesia Checklist: Patient identified, Emergency Drugs available, Suction available, Patient being monitored and Timeout performed Patient Re-evaluated:Patient Re-evaluated prior to induction Oxygen Delivery Method: Nasal cannula Preoxygenation: Pre-oxygenation with 100% oxygen Induction Type: IV induction Placement Confirmation: positive ETCO2 and CO2 detector       

## 2017-12-25 NOTE — H&P (Signed)
Outpatient short stay form Pre-procedure 12/25/2017 1:06 PM [name] MD  Primary Physician: Alanson Aly, PA  Reason for visit: Colonoscopy  History of present illness: Patient is a 67 year old Thompson presenting today for colonoscopy.  He has a personal history of adenomatous colon polyps.  His last colonoscopy was 09/29/2014.  Tolerated his prep well.  He takes no aspirin or blood thinning agent with the exception of 81 mg aspirin daily.    Current Facility-Administered Medications:  .  0.9 %  sodium chloride infusion, , Intravenous, Continuous, [name], MD, Last Rate: 20 mL/hr at 11/04/Christian 1235, 1,000 mL at 11/04/Christian 1235  Medications Prior to Admission  Medication Sig Dispense Refill Last Dose  . amitriptyline (ELAVIL) 10 MG tablet Take 10 mg by mouth at bedtime.   Taking  . atorvastatin (LIPITOR) 80 MG tablet Take 80 mg by mouth daily.   12/25/2017 at 0500  . carbidopa-levodopa (SINEMET IR) 25-100 MG per tablet Take 1 tablet by mouth 3 (three) times daily.   12/25/2017 at 0500  . citalopram (CELEXA) 10 MG tablet Take 10 mg by mouth daily.   Taking  . cyanocobalamin 1000 MCG tablet Take 100 mcg by mouth daily.   Taking  . glucosamine-chondroitin 500-400 MG tablet Take 1 tablet by mouth 3 (three) times daily.   Taking  . lisinopril (PRINIVIL,ZESTRIL) 5 MG tablet Take 5 mg by mouth daily.   12/25/2017 at 0500  . metoprolol succinate (TOPROL-XL) 25 MG 24 hr tablet Take 25 mg by mouth daily.   12/25/2017 at 0500  . Multiple Vitamin (MULTIVITAMIN) tablet Take 1 tablet by mouth daily.   Taking  . mupirocin ointment (BACTROBAN) 2 % Apply two times a day for 7 days. (Patient not taking: Reported on 02/10/2017) 22 g 0 Not Taking  . nitroGLYCERIN (NITROSTAT) 0.4 MG SL tablet Place 0.4 mg under the tongue every 5 (five) minutes as needed for chest pain.   Not Taking  . omega-3 acid ethyl esters (LOVAZA) 1 G capsule Take by mouth 2 (two) times daily.   Taking  . pantoprazole  (PROTONIX) 40 MG tablet Take 40 mg by mouth daily.   Taking  . rOPINIRole (REQUIP) 0.5 MG tablet Take 0.5 mg by mouth 3 (three) times daily.   Taking  . triamcinolone (NASACORT AQ) 55 MCG/ACT AERO nasal inhaler Place 2 sprays into the nose daily.   Not Taking  . vitamin C (ASCORBIC ACID) 500 MG tablet Take 500 mg by mouth daily.   Taking     Allergies  Allergen Reactions  . Penicillins   . Tramadol      Past Medical History:  Diagnosis Date  . Acid reflux 03/07/2011  . Arteriosclerosis of coronary artery 03/07/2011   Overview:  Non ST-elevation myocardial infarction.  Status post coronary artery bypass grafting x4 with left internal mammary artery to left anterior descending artery, saphenous vein graft to posterior descending artery, obtuse marginal 2 and obtuse marginal 1 individually on 02/10/2011, by [facility].   . Bilateral carpal tunnel syndrome   . BPH (benign prostatic hyperplasia)   . Carpal tunnel syndrome 03/26/2014  . Coronary artery disease   . Diverticulosis   . Dystonia   . Erosive esophagitis   . 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    [name] GERD (gastroesophageal reflux disease)   . History of hiatal hernia   . History of kidney stones   . HLD (hyperlipidemia) 04/29/2011  Overview:  Formatting of this note may be different from the original. Component     Latest Ref Rng 02/10/2011 05/02/2011  Cholesterol, Total      171 114  Triglyceride      64 111  HDL Cholesterol      46 50  Low Density Lipoprotein      105 42    . Hyperlipidemia   . Hypertension   . IGT (impaired glucose tolerance)   . Lipids serum increased   . Parkinson disease ([place])   . Parkinson's disease ([place]) 03/07/2011  . Renal disorder   . Restless leg 03/21/2012  . RLS (restless legs syndrome)   . RLS (restless legs syndrome)     Review of systems:      Physical Exam    Heart and lungs: Regular rate and rhythm without rub or gallop, lungs are bilaterally  clear.    HEENT: Normocephalic atraumatic eyes are anicteric    Other:    Pertinant exam for procedure: Soft nontender nondistended bowel sounds positive normoactive.    Planned proceedures: Colonoscopy and indicated procedures. I have discussed the risks benefits and complications of procedures to include not limited to bleeding, infection, perforation and the risk of sedation and the patient wishes to proceed.    [name], MD Gastroenterology 12/25/2017  1:06 PM

## 2017-12-26 ENCOUNTER — Encounter: Payer: Self-pay | Admitting: Gastroenterology

## 2017-12-27 LAB — SURGICAL PATHOLOGY

## 2018-01-30 ENCOUNTER — Encounter (INDEPENDENT_AMBULATORY_CARE_PROVIDER_SITE_OTHER): Payer: Self-pay

## 2018-01-30 ENCOUNTER — Ambulatory Visit
Admission: RE | Admit: 2018-01-30 | Discharge: 2018-01-30 | Disposition: A | Discharge status: Home / Self Care | Payer: Medicare Other | Source: Ambulatory Visit | Attending: Urology | Admitting: Urology

## 2018-01-30 DIAGNOSIS — R1900 Intra-abdominal and pelvic swelling, mass and lump, unspecified site: Secondary | ICD-10-CM

## 2018-01-30 DIAGNOSIS — N400 Enlarged prostate without lower urinary tract symptoms: Secondary | ICD-10-CM | POA: Insufficient documentation

## 2018-01-30 MED ORDER — GADOBUTROL 1 MMOL/ML IV SOLN
7.5000 mL | Freq: Once | INTRAVENOUS | Status: AC | PRN
  Administered 2018-01-30: 7.5 mL via INTRAVENOUS

## 2018-02-02 ENCOUNTER — Other Ambulatory Visit: Payer: Self-pay | Admitting: Family Medicine

## 2018-02-02 DIAGNOSIS — R972 Elevated prostate specific antigen [PSA]: Secondary | ICD-10-CM

## 2018-02-02 DIAGNOSIS — Z87898 Personal history of other specified conditions: Secondary | ICD-10-CM

## 2018-02-05 ENCOUNTER — Other Ambulatory Visit: Payer: Medicare Other

## 2018-02-05 DIAGNOSIS — R972 Elevated prostate specific antigen [PSA]: Secondary | ICD-10-CM

## 2018-02-06 LAB — PSA: Prostate Specific Ag, Serum: 2.1 ng/mL (ref 0.0–4.0)

## 2018-02-08 ENCOUNTER — Ambulatory Visit: Payer: Medicare Other | Admitting: Urology

## 2018-02-28 ENCOUNTER — Ambulatory Visit (INDEPENDENT_AMBULATORY_CARE_PROVIDER_SITE_OTHER): Payer: Medicare Other | Admitting: Urology

## 2018-02-28 ENCOUNTER — Encounter: Payer: Self-pay | Admitting: Urology

## 2018-02-28 VITALS — BP 144/82 | HR 61 | Ht 65.0 in | Wt 170.0 lb

## 2018-02-28 DIAGNOSIS — D179 Benign lipomatous neoplasm, unspecified: Secondary | ICD-10-CM

## 2018-02-28 DIAGNOSIS — Z87442 Personal history of urinary calculi: Secondary | ICD-10-CM

## 2018-02-28 DIAGNOSIS — R972 Elevated prostate specific antigen [PSA]: Secondary | ICD-10-CM | POA: Diagnosis not present

## 2018-02-28 NOTE — Progress Notes (Signed)
02/28/2018  4:04 PM   [name] 28-Oct-1950 [id number]  Referring provider: [name] [address] [place], [place] [address]  Chief Complaint  Patient presents with  . Benign Prostatic Hypertrophy    1 yr follow up  . Pelvic Mass  . Elevated PSA    HPI: Christian Thompson is a 68 y.o. male with Parkinson's who returns today for routine urologic follow-up.  Notably today, the patient does report that he has left lower extremity edema.  He has had this in the past.  It seems to be progressing.  No shortness of breath or any other symptoms..  History of nephrolithiasis CT abdomen and pelvis showed completed on 05/28/2015 noted 2 left distal ureteral stones measuring up to 4 mm each. Spontaneous passage of stones.   RUS completed on 06/22/2015 noted resolution of the left-sided hydronephrosis and a right renal cyst.   Stone composition is unknown.  KUB taken on 06/20/2016 noted no nephrolithiasis.  No stone episodes, asyptomatic as of 02/28/2018.  History of elevated PSA/ possible prostate cancer 07/24/2012  2.1 08/2012 prostate biopsy - small focus of glands suspicious for prostate cancer, right lateral base 07/24/2013  2.38 01/20/14  2.28 5/17 2.9 05/2016 2.58  --> started finasteride shortly thereafter 12/19 2.1   BPH/ LUTS/ history of bladder stones S/p via cystolitholapaxy by [name] Currently asymptomatic, off all BPH medications.  Denies any urgency, frequency, dysuria, or weak stream.  Feels like he is emptying his bladder without difficulty.  History of gross hematuria S/p work up 06/2016  No further episodes of gross blood as of current.  Pelvic mass Incidental finding of increasingly conspicuous predominantly fatty attenuation lesion measuring 6.6 x 4.9 x 5.7 cm in the right hemipelvis extending through the right obturator foramen on 5/218.  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    MRI on  01/30/2018 shows that the mass has remained stable and patient denies pelvic pain.  PMH significant for parkinson's, CAD  PMH: Past Medical History:  Diagnosis Date  . Acid reflux 03/07/2011  . Arteriosclerosis of coronary artery 03/07/2011   Overview:  Non ST-elevation myocardial infarction.  Status post coronary artery bypass grafting x4 with left internal mammary artery to left anterior descending artery, saphenous vein graft to posterior descending artery, obtuse marginal 2 and obtuse marginal 1 individually on 02/10/2011, by [facility].   . Bilateral carpal tunnel syndrome   . BPH (benign prostatic hyperplasia)   . Carpal tunnel syndrome 03/26/2014  . Coronary artery disease   . Diverticulosis   . Dystonia   . Erosive esophagitis   . 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    [name] GERD (gastroesophageal reflux disease)   . History of hiatal hernia   . History of kidney stones   . HLD (hyperlipidemia) 04/29/2011   Overview:  Formatting of this note may be different from the original. Component     Latest Ref Rng 02/10/2011 05/02/2011  Cholesterol, Total      171 114  Triglyceride      64 111  HDL Cholesterol      46 50  Low Density Lipoprotein      105 42    . Hyperlipidemia   . Hypertension   . IGT (impaired glucose tolerance)   . Lipids serum increased   . Parkinson disease ([place])   . Parkinson's disease ([place]) 03/07/2011  . Renal  disorder   . Restless leg 03/21/2012  . RLS (restless legs syndrome)   . RLS (restless legs syndrome)     Surgical History: Past Surgical History:  Procedure Laterality Date  . CARDIAC CATHETERIZATION    . COLONOSCOPY WITH PROPOFOL N/A 09/29/2014   Procedure: COLONOSCOPY WITH PROPOFOL;  Surgeon: [name], MD;  Location: [facility] ENDOSCOPY;  Service: Endoscopy;  Laterality: N/A;  . COLONOSCOPY WITH PROPOFOL N/A 12/25/2017   Procedure: COLONOSCOPY WITH PROPOFOL;  Surgeon: [name], MD;  Location: [facility]  ENDOSCOPY;  Service: Endoscopy;  Laterality: N/A;  . CORONARY ARTERY BYPASS GRAFT    . ESOPHAGOGASTRODUODENOSCOPY (EGD) WITH PROPOFOL N/A 09/29/2014   Procedure: ESOPHAGOGASTRODUODENOSCOPY (EGD) WITH PROPOFOL;  Surgeon: [name], MD;  Location: [facility] ENDOSCOPY;  Service: Endoscopy;  Laterality: N/A;  . ESOPHAGOGASTRODUODENOSCOPY (EGD) WITH PROPOFOL N/A 06/26/2015   Procedure: ESOPHAGOGASTRODUODENOSCOPY (EGD) WITH PROPOFOL;  Surgeon: [name], MD;  Location: [facility] ENDOSCOPY;  Service: Endoscopy;  Laterality: N/A;  . HAND SURGERY    . hemmorrhoidectomy    . HERNIA REPAIR    . LITHOTRIPSY    . NEUROPLASTY / TRANSPOSITION MEDIAN NERVE AT CARPAL TUNNEL Left     Home Medications:  Allergies as of 02/28/2018      Reactions   Penicillins    Tramadol       Medication List       Accurate as of February 28, 2018  4:04 PM. Always use your most recent med list.        amitriptyline 10 MG tablet Commonly known as:  ELAVIL Take 10 mg by mouth at bedtime.   atorvastatin 80 MG tablet Commonly known as:  LIPITOR Take 80 mg by mouth daily.   benzonatate 100 MG capsule Commonly known as:  TESSALON Take by mouth.   carbidopa-levodopa 25-100 MG tablet Commonly known as:  SINEMET IR Take 1 tablet by mouth 3 (three) times daily.   citalopram 10 MG tablet Commonly known as:  CELEXA Take 10 mg by mouth daily.   cyanocobalamin 1000 MCG tablet Take 100 mcg by mouth daily.   GAVILYTE-N WITH FLAVOR PACK 420 g solution Generic drug:  polyethylene glycol-electrolytes TAKE 4,000 MLS BY MOUTH AS DIRECTED   glucosamine-chondroitin 500-400 MG tablet Take 1 tablet by mouth 3 (three) times daily.   hyoscyamine 0.375 MG 12 hr tablet Commonly known as:  LEVBID Take by mouth.   lisinopril 5 MG tablet Commonly known as:  PRINIVIL,ZESTRIL Take 5 mg by mouth daily.   metoprolol succinate 25 MG 24 hr tablet Commonly known as:  TOPROL-XL Take 25 mg by mouth daily.   multivitamin  tablet Take 1 tablet by mouth daily.   mupirocin ointment 2 % Commonly known as:  BACTROBAN Apply two times a day for 7 days.   NASACORT AQ 55 MCG/ACT Aero nasal inhaler Generic drug:  triamcinolone Place 2 sprays into the nose daily.   nitroGLYCERIN 0.4 MG SL tablet Commonly known as:  NITROSTAT Place 0.4 mg under the tongue every 5 (five) minutes as needed for chest pain.   omega-3 acid ethyl esters 1 g capsule Commonly known as:  LOVAZA Take by mouth 2 (two) times daily.   pantoprazole 40 MG tablet Commonly known as:  PROTONIX Take 40 mg by mouth daily.   rOPINIRole 0.5 MG tablet Commonly known as:  REQUIP Take 0.5 mg by mouth 3 (three) times daily.   vitamin C 500 MG tablet Commonly known as:  ASCORBIC ACID Take 500 mg by  mouth daily.       Allergies:  Allergies  Allergen Reactions  . Penicillins   . Tramadol     Family History: Family History  Problem Relation Age of Onset  . Asthma Mother   . Emphysema Mother   . Alzheimer's disease Father     Social History:  reports that he has never smoked. He quit smokeless tobacco use about 11 years ago.  His smokeless tobacco use included chew. He reports that he does not drink alcohol or use drugs.  ROS: UROLOGY Frequent Urination?: No Hard to postpone urination?: No Burning/pain with urination?: No Get up at night to urinate?: No Leakage of urine?: No Urine stream starts and stops?: No Trouble starting stream?: No Do you have to strain to urinate?: No Blood in urine?: No Urinary tract infection?: No Sexually transmitted disease?: No Injury to kidneys or bladder?: No Painful intercourse?: No Weak stream?: No Erection problems?: No Penile pain?: No  Gastrointestinal Nausea?: No Vomiting?: No Indigestion/heartburn?: No Diarrhea?: No Constipation?: No  Constitutional Fever: No Night sweats?: No Weight loss?: No Fatigue?: No  Skin Skin rash/lesions?: No Itching?: No  Eyes Blurred  vision?: No Double vision?: No  Ears/Nose/Throat Sore throat?: No Sinus problems?: Yes  Hematologic/Lymphatic Swollen glands?: No Easy bruising?: No  Cardiovascular Leg swelling?: Yes Chest pain?: No  Respiratory Cough?: Yes Shortness of breath?: No  Endocrine Excessive thirst?: No  Musculoskeletal Back pain?: No Joint pain?: No  Neurological Headaches?: No Dizziness?: No  Psychologic Depression?: No Anxiety?: No  Physical Exam: BP (!) 144/82 (BP Location: Left Arm, Patient Position: Sitting)   Pulse 61   Ht 5\' 5"  (1.651 m)   Wt 170 lb (77.1 kg)   BMI 28.29 kg/m   Constitutional:  Alert and oriented, No acute distress.  Accompanied by wife today.   Cardiovascular: No clubbing, cyanosis.  Left lower limb edema. Respiratory: Normal respiratory effort, no increased work of breathing. Rectal: Patient with normal sphincter tone. Prostate is approximately 40 grams, non-tender, no nodules are appreciated. Skin: No rashes, bruises or suspicious lesions. Neurologic: Grossly intact, no focal deficits, moving all 4 extremities. Psychiatric: Normal mood and affect.  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 Subsequent encounter.  EXAM: MRI PELVIS WITHOUT AND WITH CONTRAST  TECHNIQUE: Multiplanar, multisequence MR imaging of the pelvis was performed both before and after administration of intravenous contrast.  CONTRAST:  7.5 cc MultiHance IV.  COMPARISON:  MRI of the pelvis 02/07/2017 and 07/28/2016. CT abdomen and pelvis 05/28/2015.  FINDINGS: Bones/Joint/Cartilage  Marrow signal is normal throughout without fracture, stress change or focal lesion.  Ligaments  Negative.  Muscles and Tendons  T1 hyperintense lesion in the right  obturator internus extending through the obturator foramen into the obturator externus is again seen. Overall the lesion measures 6.3 x 6.2 cm in the axial plane on image 24 of series 3 by 5.8 cm in the sagittal plane on image 20 of series 5, unchanged. The lesion measures 5.8 cm craniocaudal on sagittal image 20 of series 5, unchanged. As on the prior examination, a few septations are seen throughout the lesion. Hazy area of increased T2 signal in the component of the lesion in the obturator externus and in the segment of the lesion passing through  the obturator foramen appear unchanged.  Soft tissues  Prostatomegaly with the median lobe and extending into the urinary bladder appears unchanged.  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 Continued surveillance in 1 year to ensure stability is recommended. If the lesion enlarges or becomes painful, imaging before 1 year is recommended.  Prostatomegaly   Electronically Signed   By: Inge Rise M.D.   On: 01/30/2018 14:10  MRI personally reviewed, neurologic interpretation.  Assessment & Plan:    1. 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 History of elevated PSA PSA is stable Will continue to follow PSA in 1 year with rectal  3. History of nephrolithiasis Continues to be asymptomatic with no stones on last KUB We will follow clinically  4. BPH with obstruction/lower urinary tract symptoms No longer on BPH medications, minimal urinary symptoms  5. Left Lower Extremity Edema Patient advised to call PCP today about left lower extremity edema, patient agreed.  Patient was advised to call his cardiologist if PCP could not see him in a  timely manner. If symptoms worsen or he is not able to get in contact with the above, advised to go the emergency room for further evaluation.  Return in about 1 year (around 03/01/2019) for MRI, PSA.  I, Adele Schilder, am acting as a scribe for Hollice Espy, MD.    I have reviewed the above documentation for accuracy and completeness, and I agree with the above.   Hollice Espy, MD   [facility] [address] [place], [place] [address] [phone]

## 2018-03-01 ENCOUNTER — Other Ambulatory Visit: Payer: Self-pay | Admitting: Internal Medicine

## 2018-03-01 ENCOUNTER — Ambulatory Visit
Admission: RE | Admit: 2018-03-01 | Discharge: 2018-03-01 | Disposition: A | Discharge status: Home / Self Care | Payer: Medicare Other | Source: Ambulatory Visit | Attending: Internal Medicine | Admitting: Internal Medicine

## 2018-03-01 DIAGNOSIS — R600 Localized edema: Secondary | ICD-10-CM

## 2019-01-10 ENCOUNTER — Encounter: Payer: Self-pay | Admitting: *Deleted

## 2019-02-01 ENCOUNTER — Other Ambulatory Visit: Payer: Self-pay | Admitting: Gerontology

## 2019-02-01 DIAGNOSIS — Z7189 Other specified counseling: Secondary | ICD-10-CM

## 2019-02-01 DIAGNOSIS — Z0000 Encounter for general adult medical examination without abnormal findings: Secondary | ICD-10-CM

## 2019-02-01 DIAGNOSIS — R1900 Intra-abdominal and pelvic swelling, mass and lump, unspecified site: Secondary | ICD-10-CM

## 2019-02-12 ENCOUNTER — Ambulatory Visit
Admission: RE | Admit: 2019-02-12 | Discharge: 2019-02-12 | Disposition: A | Discharge status: Home / Self Care | Payer: Medicare Other | Source: Ambulatory Visit | Attending: Gerontology | Admitting: Gerontology

## 2019-02-12 ENCOUNTER — Other Ambulatory Visit: Payer: Self-pay

## 2019-02-12 DIAGNOSIS — Z0000 Encounter for general adult medical examination without abnormal findings: Secondary | ICD-10-CM | POA: Diagnosis present

## 2019-02-12 DIAGNOSIS — R1900 Intra-abdominal and pelvic swelling, mass and lump, unspecified site: Secondary | ICD-10-CM | POA: Insufficient documentation

## 2019-02-12 DIAGNOSIS — Z7189 Other specified counseling: Secondary | ICD-10-CM | POA: Insufficient documentation

## 2019-02-12 MED ORDER — GADOBUTROL 1 MMOL/ML IV SOLN
7.0000 mL | Freq: Once | INTRAVENOUS | Status: AC | PRN
  Administered 2019-02-12: 19:00:00 7 mL via INTRAVENOUS

## 2019-03-05 ENCOUNTER — Other Ambulatory Visit: Payer: Self-pay

## 2019-03-05 ENCOUNTER — Other Ambulatory Visit: Payer: Medicare Other

## 2019-03-05 DIAGNOSIS — R972 Elevated prostate specific antigen [PSA]: Secondary | ICD-10-CM

## 2019-03-05 NOTE — Addendum Note (Signed)
Addended by: Evelina Bucy on: 03/05/2019 08:34 AM   Modules accepted: Orders

## 2019-03-06 LAB — PSA: Prostate Specific Ag, Serum: 3 ng/mL (ref 0.0–4.0)

## 2019-03-07 ENCOUNTER — Ambulatory Visit: Payer: Medicare Other | Admitting: Urology

## 2019-03-12 ENCOUNTER — Encounter: Payer: Self-pay | Admitting: Urology

## 2019-03-12 ENCOUNTER — Ambulatory Visit (INDEPENDENT_AMBULATORY_CARE_PROVIDER_SITE_OTHER): Payer: Medicare Other | Admitting: Urology

## 2019-03-12 ENCOUNTER — Other Ambulatory Visit: Payer: Self-pay

## 2019-03-12 VITALS — BP 124/77 | HR 56 | Ht 65.0 in | Wt 167.0 lb

## 2019-03-12 DIAGNOSIS — D179 Benign lipomatous neoplasm, unspecified: Secondary | ICD-10-CM

## 2019-03-12 DIAGNOSIS — N401 Enlarged prostate with lower urinary tract symptoms: Secondary | ICD-10-CM

## 2019-03-12 DIAGNOSIS — N138 Other obstructive and reflux uropathy: Secondary | ICD-10-CM

## 2019-03-12 DIAGNOSIS — Z87898 Personal history of other specified conditions: Secondary | ICD-10-CM

## 2019-03-12 LAB — BLADDER SCAN AMB NON-IMAGING: Scan Result: 56

## 2019-03-12 NOTE — Progress Notes (Signed)
03/12/2019 12:49 PM   Christian Thompson 1950/03/23 [id number]  Referring provider: Medicine, [facility] [address] [place],  [place] [address]  Chief Complaint  Patient presents with  . Elevated PSA    HPI: 68 year old male with multiple GU issues who presents today for routine annual follow-up.  History of nephrolithiasis CT abdomen and pelvis showedcompleted on 05/28/2015 noted2 left distal ureteral stones measuring up to 4 mm each. Spontaneous passage of stones.RUS completed on 06/22/2015 noted resolution of the left-sided hydronephrosis and a right renal cyst.Stone composition is unknown. KUB taken on 06/20/2016 noted no nephrolithiasis.  Stone episodes this year.  No flank pain or gross hematuria.  History of elevated PSA/ possible prostate cancer 07/24/2012 2.1 08/2012 prostate biopsy - small focus of glands suspicious for prostate cancer, right lateral base 07/24/2013 2.38 01/20/14 2.28 5/17 2.9 05/2016 2.58  --> started finasteride shortly thereafter 12/19 2.1   1/21  3.0  No longer on finasteride   BPH/ LUTS/ history of bladder stones S/p via cystolitholapaxy by [name] Not currently on BPH medication Occasionally starting and stopping of his urinary stream he thinks may be related to Parkinson's Gets up once at night to void In the morning, he has noticed to void twice especially in the morning to completely empty of his bladder but after that he does fairly well during the daytime. He is overall satisfied.  IPSS as below.   IPSS    Row Name 03/12/19 1000         International Prostate Symptom Score   How often have you had the sensation of not emptying your bladder?  Almost always     How often have you had to urinate less than every two hours?  Almost always     How often have you found you stopped and started again several times when you urinated?  Almost always     How often have you found it difficult to postpone  urination?  About half the time     How often have you had a weak urinary stream?  More than half the time     How often have you had to strain to start urination?  Less than half the time     How many times did you typically get up at night to urinate?  1 Time     Total IPSS Score  25       Quality of Life due to urinary symptoms   If you were to spend the rest of your life with your urinary condition just the way it is now how would you feel about that?  Mostly Satisfied        Score:  1-7 Mild 8-19 Moderate 20-35 Severe   History of gross hematuria S/p work up 06/2016   Pelvic mass Incidental finding of increasingly conspicuous predominantly fatty attenuation lesion measuring 6.6 x 4.9 x 5.7 cm in the right hemipelvis extending through the right obturator foramen on 5/218.  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    MRI on 01/30/2018 shows that the mass has remained stable and patient denies pelvic pain.  For what ever reason, his MRI this year was ordered by his PCP.  It shows slightly decreased size of the pelvic mass as stated above without significant change otherwise.    PMH significant for parkinson's, CAD  PMH: Past Medical History:  Diagnosis Date  . Acid reflux 03/07/2011  . Arteriosclerosis of  coronary artery 03/07/2011   Overview:  Non ST-elevation myocardial infarction.  Status post coronary artery bypass grafting x4 with left internal mammary artery to left anterior descending artery, saphenous vein graft to posterior descending artery, obtuse marginal 2 and obtuse marginal 1 individually on 02/10/2011, by [facility].   . Bilateral carpal tunnel syndrome   . BPH (benign prostatic hyperplasia)   . Carpal tunnel syndrome 03/26/2014  . Coronary artery disease   . Diverticulosis   . Dystonia   . Erosive esophagitis   . 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    [name] GERD  (gastroesophageal reflux disease)   . History of hiatal hernia   . History of kidney stones   . HLD (hyperlipidemia) 04/29/2011   Overview:  Formatting of this note may be different from the original. Component     Latest Ref Rng 02/10/2011 05/02/2011  Cholesterol, Total      171 114  Triglyceride      64 111  HDL Cholesterol      46 50  Low Density Lipoprotein      105 42    . Hyperlipidemia   . Hypertension   . IGT (impaired glucose tolerance)   . Lipids serum increased   . Parkinson disease ([place])   . Parkinson's disease ([place]) 03/07/2011  . Renal disorder   . Restless leg 03/21/2012  . RLS (restless legs syndrome)   . RLS (restless legs syndrome)     Surgical History: Past Surgical History:  Procedure Laterality Date  . CARDIAC CATHETERIZATION    . COLONOSCOPY WITH PROPOFOL N/A 09/29/2014   Procedure: COLONOSCOPY WITH PROPOFOL;  Surgeon: [name], MD;  Location: [facility] ENDOSCOPY;  Service: Endoscopy;  Laterality: N/A;  . COLONOSCOPY WITH PROPOFOL N/A 12/25/2017   Procedure: COLONOSCOPY WITH PROPOFOL;  Surgeon: [name], MD;  Location[facility] ENDOSCOPY;  Service: Endoscopy;  Laterality: N/A;  . CORONARY ARTERY BYPASS GRAFT    . ESOPHAGOGASTRODUODENOSCOPY (EGD) WITH PROPOFOL N/A 09/29/2014   Procedure: ESOPHAGOGASTRODUODENOSCOPY (EGD) WITH PROPOFOL;  Surgeon: [name], MD;  Location: [facility] ENDOSCOPY;  Service: Endoscopy;  Laterality: N/A;  . ESOPHAGOGASTRODUODENOSCOPY (EGD) WITH PROPOFOL N/A 06/26/2015   Procedure: ESOPHAGOGASTRODUODENOSCOPY (EGD) WITH PROPOFOL;  Surgeon: [name], MD;  Location: [facility] ENDOSCOPY;  Service: Endoscopy;  Laterality: N/A;  . HAND SURGERY    . hemmorrhoidectomy    . HERNIA REPAIR    . LITHOTRIPSY    . NEUROPLASTY / TRANSPOSITION MEDIAN NERVE AT CARPAL TUNNEL Left     Home Medications:  Allergies as of 03/12/2019      Reactions   Penicillins    Tramadol       Medication List       Accurate as of March 12, 2019 12:49 PM. If  you have any questions, ask your nurse or doctor.        amitriptyline 10 MG tablet Commonly known as: ELAVIL Take 10 mg by mouth at bedtime.   atorvastatin 80 MG tablet Commonly known as: LIPITOR Take 80 mg by mouth daily.   benzonatate 100 MG capsule Commonly known as: TESSALON Take by mouth.   carbidopa-levodopa 25-100 MG tablet Commonly known as: SINEMET IR Take 1 tablet by mouth 3 (three) times daily.   citalopram 10 MG tablet Commonly known as: CELEXA Take 10 mg by mouth daily.   cyanocobalamin 1000 MCG tablet Take 100 mcg by mouth daily.   GaviLyte-N with Flavor Pack 420 g  solution Generic drug: polyethylene glycol-electrolytes TAKE 4,000 MLS BY MOUTH AS DIRECTED   glucosamine-chondroitin 500-400 MG tablet Take 1 tablet by mouth 3 (three) times daily.   hyoscyamine 0.375 MG 12 hr tablet Commonly known as: LEVBID Take by mouth.   lisinopril 5 MG tablet Commonly known as: ZESTRIL Take 5 mg by mouth daily.   metoprolol succinate 25 MG 24 hr tablet Commonly known as: TOPROL-XL Take 25 mg by mouth daily.   multivitamin tablet Take 1 tablet by mouth daily.   mupirocin ointment 2 % Commonly known as: BACTROBAN Apply two times a day for 7 days.   Nasacort AQ 55 MCG/ACT Aero nasal inhaler Generic drug: triamcinolone Place 2 sprays into the nose daily.   nitroGLYCERIN 0.4 MG SL tablet Commonly known as: NITROSTAT Place 0.4 mg under the tongue every 5 (five) minutes as needed for chest pain.   omega-3 acid ethyl esters 1 g capsule Commonly known as: LOVAZA Take by mouth 2 (two) times daily.   pantoprazole 40 MG tablet Commonly known as: PROTONIX Take 40 mg by mouth daily.   rOPINIRole 0.5 MG tablet Commonly known as: REQUIP Take 0.5 mg by mouth 3 (three) times daily.   vitamin C 500 MG tablet Commonly known as: ASCORBIC ACID Take 500 mg by mouth daily.       Allergies:  Allergies  Allergen Reactions  . Penicillins   . Tramadol      Family History: Family History  Problem Relation Age of Onset  . Asthma Mother   . Emphysema Mother   . Alzheimer's disease Father     Social History:  reports that he has never smoked. He quit smokeless tobacco use about 12 years ago.  His smokeless tobacco use included chew. He reports that he does not drink alcohol or use drugs.  ROS: UROLOGY Frequent Urination?: No Hard to postpone urination?: No Burning/pain with urination?: No Get up at night to urinate?: No Leakage of urine?: No Urine stream starts and stops?: No Trouble starting stream?: No Do you have to strain to urinate?: No Blood in urine?: No Urinary tract infection?: No Sexually transmitted disease?: No Injury to kidneys or bladder?: No Painful intercourse?: No Weak stream?: No Erection problems?: No Penile pain?: No  Gastrointestinal Nausea?: No Vomiting?: No Indigestion/heartburn?: No Diarrhea?: No Constipation?: No  Constitutional Fever: No Night sweats?: No Weight loss?: No Fatigue?: No  Skin Skin rash/lesions?: No Itching?: No  Eyes Blurred vision?: No Double vision?: No  Ears/Nose/Throat Sore throat?: No Sinus problems?: No  Hematologic/Lymphatic Swollen glands?: No Easy bruising?: No  Cardiovascular Leg swelling?: No Chest pain?: No  Respiratory Cough?: Yes Shortness of breath?: No  Endocrine Excessive thirst?: No  Musculoskeletal Back pain?: No Joint pain?: Yes  Neurological Headaches?: No Dizziness?: No  Psychologic Depression?: No Anxiety?: No  Physical Exam: BP 124/77   Pulse (!) 56   Ht 5\' 5"  (1.651 m)   Wt 167 lb (75.8 kg)   BMI 27.79 kg/m   Constitutional:  Alert and oriented, No acute distress. HEENT: [place] AT, moist mucus membranes.  Trachea midline, no masses. Cardiovascular: No clubbing, cyanosis, or edema. Respiratory: Normal respiratory effort, no increased work of breathing. GI: Abdomen is soft, nontender, nondistended, no abdominal  masses Rectal: Normal sphincter tone. 40 cc prostate, nontender no nodules. Skin: No rashes, bruises or suspicious lesions. Neurologic: Grossly intact, no focal deficits, moving all 4 extremities. Psychiatric: Normal mood and affect.  Laboratory Data: Lab Results  Component Value Date  WBC 12.1 (H) 05/28/2015   HGB 14.8 05/28/2015   HCT 43.8 05/28/2015   MCV 87.5 05/28/2015   PLT 150 05/28/2015    Lab Results  Component Value Date   CREATININE 1.03 06/27/2016   PSA as above.   Pertinent Imaging: CLINICAL DATA:  Mass in the pelvis.  EXAM: MRI PELVIS WITHOUT AND WITH CONTRAST  TECHNIQUE: Multiplanar multisequence MR imaging of the pelvis was performed both before and after administration of intravenous contrast.  CONTRAST:  90mL GADAVIST GADOBUTROL 1 MMOL/ML IV SOLN  COMPARISON:  01/30/2018  FINDINGS: Urinary Tract: Urinary bladder is mildly trabeculated in the setting of prostatomegaly. No ureteral dilation.  Bowel: Rectum and sphincter complex displaced by fatty mass in the right pelvis. Signs of diverticulosis. Visualized bowel loops otherwise unremarkable.  Vascular/Lymphatic: Is is is vascular structures are patent. No sign of pelvic lymphadenopathy.  Reproductive: Prostatomegaly with hypertrophy of the median lobe extending into the bladder base similar to prior study. Seminal vesicles are normal.  Other:  No ascites.  Musculoskeletal: Fatty mass in the right pelvis along the pelvic floor extending into obturator foramen and interdigitating with abductor an obturator externus as well as pelvic floor musculature, extending into abductor compartment of the right upper thigh.  In the coronal plane along the lower margin of the pelvic floor this measures 6.5 cm in greatest craniocaudal dimension previously 6.2 cm and in June of 2018 this measured approximately 5.6 cm. On the December of 2019 evaluation this measured approximately 6.2 cm  in greatest craniocaudal span. The character of the lesion in this location is similar, following fat on all sequences with the exception of subtle areas which show linear hypointensity on T2, associated with some surrounding T2 hyperintensity and with some areas of enhancement particularly just internal to the obturator canal.  Internal septations remain fine and there is resolution of some of the complexity and internal heterogeneity seen on the previous scan in the operator and abductor compartment component.  Along the right operator internus in the axial plane above the area where the lesion passes through the obturator foramen (image 30, series 4) this measures 6 x 4.5 cm as compared to 5.8 x 4.2 cm on the current study and 5.2 x 3.6 cm on the exam of 07/28/2016.  Along the medial margin of the operator canal the lesion measures 6 point 7 x 4.3 cm as compared to 6.9 x 4.0 cm.  In the upper margin of the abductor musculature (image 34, series 4) this area measures 6.6 x 2.7 cm as compared to 6.3 x 2.7 cm (December 2018). In June of 2018 this area measured 5.5 x 1.9 cm.  IMPRESSION: 1. Slight interval decrease in size of fatty mass in the right pelvis along the pelvic floor and extending into the abductor compartment of the right upper thigh. This lesion is compatible with atypical lipomatous lesion. Septations remain fine but size is large and there is involvement of deep structures along the right pelvic floor and obturator foramen and abductor compartments. If surgical excision is not considered continued follow-up is suggested to assess for any areas of nodular enhancement or continued enlargement. Follow-up could be considered at 6 month intervals given size and mild internal complexity. 2. No evidence of pelvic lymphadenopathy. 3. Prostatomegaly with hypertrophy of the median lobe extending into the bladder base. 4. Sigmoid diverticulosis.   Electronically  Signed   By: Zetta Bills M.D.   On: 02/13/2019 09:23  MRI personally reviewed today. Agree with radiologic  interpretation. Pelvic lipoma and prostate specifically reviewed.  Assessment & Plan:    1. 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 BPH with urinary obstruction BPH with intermittency and nocturia  Overall minimal bother and no interested in surgery or resuming medications  Will return if symptoms worsen  Adequate emptying - Bladder Scan (Post Void Residual) in office - PSA; Future  3. History of elevated PSA History of elevated PSA although PSA remains stable, strong  Rectal exam reassuring  Furthermore, prostate MRI not indicated with significant prostatic pathology other than prostamegaly with median lobe.  We will continue to follow this annual with MRI while surveying #1.   Return in about 1 year (around 03/11/2020) for PSA/ DRE/ IPSS/ PVR, pelvic MR.  Hollice Espy, MD  [facility] [address], [place], [place] [address] [phone]

## 2020-03-11 ENCOUNTER — Ambulatory Visit: Payer: Medicare Other

## 2020-03-17 ENCOUNTER — Ambulatory Visit: Payer: Self-pay | Admitting: Urology

## 2020-03-19 ENCOUNTER — Telehealth: Payer: Self-pay | Admitting: *Deleted

## 2020-03-19 MED ORDER — DIAZEPAM 10 MG PO TABS: 10.0000 mg | ORAL_TABLET | Freq: Once | ORAL | 0 refills | Status: AC

## 2020-03-19 NOTE — Telephone Encounter (Signed)
Sent to pharmacy.  He will need a driver.    Hollice Espy, MD

## 2020-03-19 NOTE — Telephone Encounter (Signed)
Spoke with patient and advised results He will have a driver

## 2020-03-19 NOTE — Telephone Encounter (Signed)
Pt calling stating that he will be having a MRI tomorrow and wanted something to keep him calm during the scan. Please send to [facility] [place].

## 2020-03-20 ENCOUNTER — Other Ambulatory Visit: Payer: Self-pay

## 2020-03-20 ENCOUNTER — Ambulatory Visit
Admission: RE | Admit: 2020-03-20 | Discharge: 2020-03-20 | Disposition: A | Discharge status: Home / Self Care | Payer: Medicare Other | Source: Ambulatory Visit | Attending: Urology | Admitting: Urology

## 2020-03-20 DIAGNOSIS — D179 Benign lipomatous neoplasm, unspecified: Secondary | ICD-10-CM | POA: Insufficient documentation

## 2020-03-20 MED ORDER — GADOBUTROL 1 MMOL/ML IV SOLN
7.5000 mL | Freq: Once | INTRAVENOUS | Status: AC | PRN
  Administered 2020-03-20: 7.5 mL via INTRAVENOUS

## 2020-04-01 ENCOUNTER — Ambulatory Visit: Payer: Medicare Other | Admitting: Urology

## 2020-04-08 ENCOUNTER — Encounter: Payer: Self-pay | Admitting: Urology

## 2020-04-08 ENCOUNTER — Other Ambulatory Visit: Payer: Self-pay

## 2020-04-08 ENCOUNTER — Ambulatory Visit (INDEPENDENT_AMBULATORY_CARE_PROVIDER_SITE_OTHER): Payer: Medicare Other | Admitting: Urology

## 2020-04-08 VITALS — BP 145/89 | HR 65

## 2020-04-08 DIAGNOSIS — D179 Benign lipomatous neoplasm, unspecified: Secondary | ICD-10-CM | POA: Diagnosis not present

## 2020-04-08 DIAGNOSIS — Z87898 Personal history of other specified conditions: Secondary | ICD-10-CM

## 2020-04-08 DIAGNOSIS — N401 Enlarged prostate with lower urinary tract symptoms: Secondary | ICD-10-CM

## 2020-04-08 DIAGNOSIS — N138 Other obstructive and reflux uropathy: Secondary | ICD-10-CM

## 2020-04-08 LAB — BLADDER SCAN AMB NON-IMAGING: Scan Result: 5

## 2020-04-08 NOTE — Progress Notes (Signed)
04/08/2020 9:20 AM   Christian Thompson Feb 06, 1951 [id number]  Referring provider: Medicine, [facility] [address] [place],  [place] [address]  Chief Complaint  Patient presents with  . Benign Prostatic Hypertrophy    HPI: 70 year old male with multiple GU issues who returns today for routine annual follow-up.  History of nephrolithiasis CT abdomen and pelvis showedcompleted on 05/28/2015 noted2 left distal ureteral stones measuring up to 4 mm each. Spontaneous passage of stones.RUS completed on 06/22/2015 noted resolution of the left-sided hydronephrosis and a right renal cyst.Stone composition is unknown. KUB taken on 06/20/2016 noted no nephrolithiasis.  No stone events or flank pain over the past year. No recent cross-sectional imaging involving kidneys.  History of elevated PSA/ possible prostate cancer 07/24/2012 2.1 08/2012 prostate biopsy - small focus of glands suspicious for prostate cancer, right lateral base 07/24/2013 2.38 01/20/14 2.28 5/17 2.9 05/2016 2.58 -->started finasteride shortly thereafter 12/19 2.1  1/21  3.0  No longer on finasteride 6/21  2.2  BPH/ LUTS/ history of bladder stones S/p via cystolitholapaxy by [name] Not currently on BPH medication H/o parkinsons  Overall urinary symptoms are stable, occasional urgency frequency but is not bothersome to him. IPSS as below. PVR minimal.  History of gross hematuria S/p work up 06/2016: No further episodes  Pelvic mass Incidental finding of increasingly conspicuous predominantly fatty attenuation lesion measuring 6.6 x 4.9 x 5.7 cm in the right hemipelvis extending through the right obturator foramen on 5/218. 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  MRI on 01/30/2018 stable MRI on 12/20 slight interval decrease MRI on 1/22 slight interval increase in size   PMH significant for parkinson's, CAD   IPSS    Row Name  04/08/20 0800         International Prostate Symptom Score   How often have you had the sensation of not emptying your bladder? Less than half the time     How often have you had to urinate less than every two hours? Less than 1 in 5 times     How often have you found you stopped and started again several times when you urinated? Less than 1 in 5 times     How often have you found it difficult to postpone urination? Less than 1 in 5 times     How often have you had a weak urinary stream? Not at All     How often have you had to strain to start urination? Less than 1 in 5 times     How many times did you typically get up at night to urinate? None     Total IPSS Score 6           Quality of Life due to urinary symptoms   If you were to spend the rest of your life with your urinary condition just the way it is now how would you feel about that? Pleased            Score:  1-7 Mild 8-19 Moderate 20-35 Severe   PMH: Past Medical History:  Diagnosis Date  . Acid reflux 03/07/2011  . Arteriosclerosis of coronary artery 03/07/2011   Overview:  Non ST-elevation myocardial infarction.  Status post coronary artery bypass grafting x4 with left internal mammary artery to left anterior descending artery, saphenous vein graft to posterior descending artery, obtuse marginal 2 and obtuse marginal 1 individually on 02/10/2011, by [facility].   [name]  Bilateral carpal tunnel syndrome   . BPH (benign prostatic hyperplasia)   . Carpal tunnel syndrome 03/26/2014  . Coronary artery disease   . Diverticulosis   . Dystonia   . Erosive esophagitis   . 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    [name] GERD (gastroesophageal reflux disease)   . History of hiatal hernia   . History of kidney stones   . HLD (hyperlipidemia) 04/29/2011   Overview:  Formatting of this note may be different from the original. Component     Latest Ref Rng 02/10/2011 05/02/2011  Cholesterol, Total       171 114  Triglyceride      64 111  HDL Cholesterol      46 50  Low Density Lipoprotein      105 42    . Hyperlipidemia   . Hypertension   . IGT (impaired glucose tolerance)   . Lipids serum increased   . Parkinson disease ([place])   . Parkinson's disease ([place]) 03/07/2011  . Renal disorder   . Restless leg 03/21/2012  . RLS (restless legs syndrome)   . RLS (restless legs syndrome)     Surgical History: Past Surgical History:  Procedure Laterality Date  . CARDIAC CATHETERIZATION    . COLONOSCOPY WITH PROPOFOL N/A 09/29/2014   Procedure: COLONOSCOPY WITH PROPOFOL;  Surgeon: [name], MD;  Location: [facility] ENDOSCOPY;  Service: Endoscopy;  Laterality: N/A;  . COLONOSCOPY WITH PROPOFOL N/A 12/25/2017   Procedure: COLONOSCOPY WITH PROPOFOL;  Surgeon: [name], MD;  Location: [facility] ENDOSCOPY;  Service: Endoscopy;  Laterality: N/A;  . CORONARY ARTERY BYPASS GRAFT    . ESOPHAGOGASTRODUODENOSCOPY (EGD) WITH PROPOFOL N/A 09/29/2014   Procedure: ESOPHAGOGASTRODUODENOSCOPY (EGD) WITH PROPOFOL;  Surgeon: [name], MD;  Location: [facility] ENDOSCOPY;  Service: Endoscopy;  Laterality: N/A;  . ESOPHAGOGASTRODUODENOSCOPY (EGD) WITH PROPOFOL N/A 06/26/2015   Procedure: ESOPHAGOGASTRODUODENOSCOPY (EGD) WITH PROPOFOL;  Surgeon: [name], MD;  Location: [facility] ENDOSCOPY;  Service: Endoscopy;  Laterality: N/A;  . HAND SURGERY    . hemmorrhoidectomy    . HERNIA REPAIR    . LITHOTRIPSY    . NEUROPLASTY / TRANSPOSITION MEDIAN NERVE AT CARPAL TUNNEL Left     Home Medications:  Allergies as of 04/08/2020      Reactions   Penicillins    Tramadol       Medication List       Accurate as of April 08, 2020  9:20 AM. If you have any questions, ask your nurse or doctor.        amitriptyline 10 MG tablet Commonly known as: ELAVIL Take 10 mg by mouth at bedtime.   atorvastatin 80 MG tablet Commonly known as: LIPITOR Take 80 mg by mouth daily.   benzonatate 100 MG capsule Commonly  known as: TESSALON Take by mouth.   carbidopa-levodopa 25-100 MG tablet Commonly known as: SINEMET IR Take 1 tablet by mouth 3 (three) times daily.   citalopram 10 MG tablet Commonly known as: CELEXA Take 10 mg by mouth daily.   cyanocobalamin 1000 MCG tablet Take 100 mcg by mouth daily.   GaviLyte-N with Flavor Pack 420 g solution Generic drug: polyethylene glycol-electrolytes TAKE 4,000 MLS BY MOUTH AS DIRECTED   glucosamine-chondroitin 500-400 MG tablet Take 1 tablet by mouth 3 (three) times daily.   hyoscyamine 0.375 MG 12 hr tablet Commonly known as: LEVBID Take by mouth.   lisinopril 5 MG tablet Commonly known as: ZESTRIL Take 5 mg  by mouth daily.   metoprolol succinate 25 MG 24 hr tablet Commonly known as: TOPROL-XL Take 25 mg by mouth daily.   multivitamin tablet Take 1 tablet by mouth daily.   mupirocin ointment 2 % Commonly known as: BACTROBAN Apply two times a day for 7 days.   nitroGLYCERIN 0.4 MG SL tablet Commonly known as: NITROSTAT Place 0.4 mg under the tongue every 5 (five) minutes as needed for chest pain.   omega-3 acid ethyl esters 1 g capsule Commonly known as: LOVAZA Take by mouth 2 (two) times daily.   pantoprazole 40 MG tablet Commonly known as: PROTONIX Take 40 mg by mouth daily.   rOPINIRole 0.5 MG tablet Commonly known as: REQUIP Take 0.5 mg by mouth 3 (three) times daily.   triamcinolone 55 MCG/ACT Aero nasal inhaler Commonly known as: NASACORT Place 2 sprays into the nose daily.   vitamin C 500 MG tablet Commonly known as: ASCORBIC ACID Take 500 mg by mouth daily.       Allergies:  Allergies  Allergen Reactions  . Penicillins   . Tramadol     Family History: Family History  Problem Relation Age of Onset  . Asthma Mother   . Emphysema Mother   . Alzheimer's disease Father     Social History:  reports that he has never smoked. He quit smokeless tobacco use about 13 years ago.  His smokeless tobacco use  included chew. He reports that he does not drink alcohol and does not use drugs.   Physical Exam: BP (!) 145/89   Pulse 65   Constitutional:  Alert and oriented, No acute distress.  Accompanied by wife today. HEENT: [place] AT, moist mucus membranes.  Trachea midline, no masses. Cardiovascular: No clubbing, cyanosis, or edema. Respiratory: Normal respiratory effort, no increased work of breathing. Skin: No rashes, bruises or suspicious lesions. Neurologic: Grossly intact, no focal deficits, moving all 4 extremities. Right upper extremity tremor appreciated. Psychiatric: Normal mood and affect.    Results for orders placed or performed in visit on 04/08/20  Bladder Scan (Post Void Residual) in office  Result Value Ref Range   Scan Result 5    MRI of the pelvis from 03/20/2020 imaging was personally reviewed today.  Assessment & Plan:    1. Benign lipomatous neoplasm Pelvic lipoma, slowly enlarging since 2017  Remains asymptomatic without features concerning for liposarcoma  He prefer to continue to follow this annually  Plan for MRI next year, does have a personal history of claustrophobia and did better this year with the Valium, request this again for next year he will call us for this prescription as his appointment approaches.  2. BPH with urinary obstruction Symptoms essentially stable on no meds with low PVR  Continue to follow annually, will intervene if symptoms worsens - Bladder Scan (Post Void Residual) in office  3. History of elevated PSA PSA has normalized, rectal exam was deferred today and will decide when to stop PSA screening down the road this depending on his overall health  Follow-up in 1 year with pelvic MRI, IPSS/PVR/possible PSA/DRE  Hollice Espy, MD  [facility] [address], [place], [place] [address] [phone]

## 2021-04-09 ENCOUNTER — Telehealth: Payer: Self-pay | Admitting: *Deleted

## 2021-04-09 DIAGNOSIS — D179 Benign lipomatous neoplasm, unspecified: Secondary | ICD-10-CM

## 2021-04-09 NOTE — Telephone Encounter (Signed)
Spoke with patient-needs Prostate MRI prior to follow up. Placed order-[facility] will call patient to schedule and then follow up with Dr. Erlene Quan. Voiced understanding.

## 2021-04-12 ENCOUNTER — Telehealth: Payer: Self-pay | Admitting: Urology

## 2021-04-12 NOTE — Telephone Encounter (Signed)
Patient's wife called and stated that patient has a MRI scheduled.  He is very Claustrophobic and is wondering if something can be called in to help calm him.  She would like rx sent to [facility] in [place].

## 2021-04-13 ENCOUNTER — Ambulatory Visit: Payer: Medicare Other | Admitting: Urology

## 2021-04-14 MED ORDER — DIAZEPAM 10 MG PO TABS: 10.0000 mg | ORAL_TABLET | Freq: Once | ORAL | 0 refills | Status: AC

## 2021-04-14 NOTE — Telephone Encounter (Signed)
Left patient a VM with details, asked to return call with any questions.  

## 2021-04-14 NOTE — Telephone Encounter (Signed)
Done, must have driver  Hollice Espy, MD

## 2021-04-26 ENCOUNTER — Ambulatory Visit: Payer: Medicare Other

## 2021-04-30 ENCOUNTER — Ambulatory Visit
Admission: RE | Admit: 2021-04-30 | Discharge: 2021-04-30 | Disposition: A | Discharge status: Home / Self Care | Payer: Medicare Other | Source: Ambulatory Visit | Attending: Urology | Admitting: Urology

## 2021-04-30 DIAGNOSIS — D179 Benign lipomatous neoplasm, unspecified: Secondary | ICD-10-CM | POA: Insufficient documentation

## 2021-04-30 MED ORDER — GADOBUTROL 1 MMOL/ML IV SOLN
7.5000 mL | Freq: Once | INTRAVENOUS | Status: AC | PRN
  Administered 2021-04-30: 7.5 mL via INTRAVENOUS

## 2021-05-10 ENCOUNTER — Telehealth: Payer: Self-pay

## 2021-05-10 NOTE — Telephone Encounter (Signed)
Patient informed, scheduled follow up ?

## 2021-05-10 NOTE — Telephone Encounter (Signed)
He is post to have an annual follow-up to discuss.  I do not see an appointment, please schedule. ? ?We will discuss results in detail at the office appointment, otherwise that can be found in my chart. ? ?Hollice Espy, MD ? ?

## 2021-05-10 NOTE — Telephone Encounter (Signed)
Patient wife left message on triage line asking for MRI results ?

## 2021-05-27 ENCOUNTER — Other Ambulatory Visit: Payer: Medicare Other

## 2021-05-31 NOTE — Progress Notes (Incomplete)
? ?05/31/21 ?5:07 PM  ? ?Gurinder A Riechers ?25-Jun-1950 ?[id number] ? ?Referring provider:  ?Latanya Maudlin, NP ?[address] ?[place],  [place] [address] ?No chief complaint on file. ? ? ? ? ?HPI: ?Christian Thompson is a 71 y.o.male with a personal history of nephrolithiasis, elevated PSA possible prostate cancer BPH with LUTS, bladder stones, gross hematuria, and pelvic mass, who presents today for MRI results.  ? ?He is s/p cystolitholapaxy by Dr Ernst Spell  ?  ?He is s/p prostate biopsy in 2014 surgical pathology showed small focus of glands suspicious for prostate cancer at right lateral base. His most recent PSA was 3.51 on 02/05/2021.  ? ?He underwent a CT abdomen and pelvis on 05/27/2017 that noted 2 left distal ureteral stones measuring up to 4 mm each.  Spontaneous passage of stones.   RUS completed on 06/22/2015 noted resolution of the left-sided hydronephrosis and a right renal cyst.   Stone composition is unknown.  KUB taken on 06/20/2016 noted no nephrolithiasis. ? ?He has a negative workup for his hematuria in 06/2016.  ? ?He was incidentally found to have increasingly conspicuous predominantly fatty attenuation lesion measuring 6.6 x 4.9 x 5.7 cm in the right hemipelvis extending through the right obturator foramen on 5/218.  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   MRI on 01/30/2018 stable. MRI on 12/20 slight interval decrease. MRI on 1/22 slight interval increase in size ? ?MRI of pelvis on 03/102023 visualized no significant interval change in fatty mass with minimal septations and without enhancement in the right pelvis extending through the obturator foramen into the right obturator muscles. No suspicious osseous lesion or lymphadenopathy. Enlarged prostate which protrudes into the base of the bladder with trabeculated urinary bladder suggestive of chronic outlet obstruction. Urology consultation would be helpful if clinically warranted. ? ?PSA trend:   ?07/24/2012  2.1 ?07/24/2013  2.38 ?01/20/14  2.28 ?06/2015 2.9 ?05/2016 2.58  --> started finasteride shortly thereafter ?01/2018 2.1   ?02/2019  3.0  No longer on finasteride ?07/2019  2.2 ?07/2020 3.67 ?01/2021 3.51 ? ?PMH: ?Past Medical History:  ?Diagnosis Date  ? Acid reflux 03/07/2011  ? Arteriosclerosis of coronary artery 03/07/2011  ? Overview:  Non ST-elevation myocardial infarction.  Status post coronary artery bypass grafting x4 with left internal mammary artery to left anterior descending artery, saphenous vein graft to posterior descending artery, obtuse marginal 2 and obtuse marginal 1 individually on 02/10/2011, by [facility].   ? Bilateral carpal tunnel syndrome   ? BPH (benign prostatic hyperplasia)   ? Carpal tunnel syndrome 03/26/2014  ? Coronary artery disease   ? Diverticulosis   ? Dystonia   ? Erosive esophagitis   ? 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    ? GERD (gastroesophageal reflux disease)   ? History of hiatal hernia   ? History of kidney stones   ? HLD (hyperlipidemia) 04/29/2011  ? Overview:  Formatting of this note may be different from the original. Component     Latest Ref Rng 02/10/2011 05/02/2011  Cholesterol, Total      171 114  Triglyceride      64 111  HDL Cholesterol      46 50  Low Density Lipoprotein      105 42    ? Hyperlipidemia   ? Hypertension   ? IGT (impaired glucose tolerance)   ? Lipids serum increased   ?  Parkinson disease ([place])   ? Parkinson's disease ([place]) 03/07/2011  ? Renal disorder   ? Restless leg 03/21/2012  ? RLS (restless legs syndrome)   ? RLS (restless legs syndrome)   ? ? ?Surgical History: ?Past Surgical History:  ?Procedure Laterality Date  ? CARDIAC CATHETERIZATION    ? COLONOSCOPY WITH PROPOFOL N/A 09/29/2014  ? Procedure: COLONOSCOPY WITH PROPOFOL;  Surgeon: [name], MD;  Location: [facility] ENDOSCOPY;  Service: Endoscopy;  Laterality: N/A;  ? COLONOSCOPY WITH PROPOFOL N/A 12/25/2017  ? Procedure: COLONOSCOPY WITH  PROPOFOL;  Surgeon: [name], MD;  Location: [facility] ENDOSCOPY;  Service: Endoscopy;  Laterality: N/A;  ? CORONARY ARTERY BYPASS GRAFT    ? ESOPHAGOGASTRODUODENOSCOPY (EGD) WITH PROPOFOL N/A 09/29/2014  ? Procedure: ESOPHAGOGASTRODUODENOSCOPY (EGD) WITH PROPOFOL;  Surgeon: [name], MD;  Location: [facility] ENDOSCOPY;  Service: Endoscopy;  Laterality: N/A;  ? ESOPHAGOGASTRODUODENOSCOPY (EGD) WITH PROPOFOL N/A 06/26/2015  ? Procedure: ESOPHAGOGASTRODUODENOSCOPY (EGD) WITH PROPOFOL;  Surgeon: [name], MD;  Location: [facility] ENDOSCOPY;  Service: Endoscopy;  Laterality: N/A;  ? HAND SURGERY    ? hemmorrhoidectomy    ? HERNIA REPAIR    ? LITHOTRIPSY    ? NEUROPLASTY / TRANSPOSITION MEDIAN NERVE AT CARPAL TUNNEL Left   ? ? ?Home Medications:  ?Allergies as of 06/01/2021   ? ?   Reactions  ? Penicillins   ? Tramadol   ? ?  ? ?  ?Medication List  ?  ? ?  ? Accurate as of May 31, 2021  5:07 PM. If you have any questions, ask your nurse or doctor.  ?  ?  ? ?  ? ?amitriptyline 10 MG tablet ?Commonly known as: ELAVIL ?Take 10 mg by mouth at bedtime. ?  ?atorvastatin 80 MG tablet ?Commonly known as: LIPITOR ?Take 80 mg by mouth daily. ?  ?benzonatate 100 MG capsule ?Commonly known as: TESSALON ?Take by mouth. ?  ?carbidopa-levodopa 25-100 MG tablet ?Commonly known as: SINEMET IR ?Take 1 tablet by mouth 3 (three) times daily. ?  ?citalopram 10 MG tablet ?Commonly known as: CELEXA ?Take 10 mg by mouth daily. ?  ?cyanocobalamin 1000 MCG tablet ?Take 100 mcg by mouth daily. ?  ?GaviLyte-N with Flavor Pack 420 g solution ?Generic drug: polyethylene glycol-electrolytes ?TAKE 4,000 MLS BY MOUTH AS DIRECTED ?  ?glucosamine-chondroitin 500-400 MG tablet ?Take 1 tablet by mouth 3 (three) times daily. ?  ?hyoscyamine 0.375 MG 12 hr tablet ?Commonly known as: LEVBID ?Take by mouth. ?  ?lisinopril 5 MG tablet ?Commonly known as: ZESTRIL ?Take 5 mg by mouth daily. ?  ?metoprolol succinate 25 MG 24 hr tablet ?Commonly known as:  TOPROL-XL ?Take 25 mg by mouth daily. ?  ?multivitamin tablet ?Take 1 tablet by mouth daily. ?  ?mupirocin ointment 2 % ?Commonly known as: BACTROBAN ?Apply two times a day for 7 days. ?  ?nitroGLYCERIN 0.4 MG SL tablet ?Commonly known as: NITROSTAT ?Place 0.4 mg under the tongue every 5 (five) minutes as needed for chest pain. ?  ?omega-3 acid ethyl esters 1 g capsule ?Commonly known as: LOVAZA ?Take by mouth 2 (two) times daily. ?  ?pantoprazole 40 MG tablet ?Commonly known as: PROTONIX ?Take 40 mg by mouth daily. ?  ?rOPINIRole 0.5 MG tablet ?Commonly known as: REQUIP ?Take 0.5 mg by mouth 3 (three) times daily. ?  ?triamcinolone 55 MCG/ACT Aero nasal inhaler ?Commonly known as: NASACORT ?Place 2 sprays into the nose daily. ?  ?vitamin C 500 MG tablet ?Commonly known as:  ASCORBIC ACID ?Take 500 mg by mouth daily. ?  ? ?  ? ? ?Allergies:  ?Allergies  ?Allergen Reactions  ? Penicillins   ? Tramadol   ? ? ?Family History: ?Family History  ?Problem Relation Age of Onset  ? Asthma Mother   ? Emphysema Mother   ? Alzheimer's disease Father   ? ? ?Social History:  reports that he has never smoked. He quit smokeless tobacco use about 14 years ago.  His smokeless tobacco use included chew. He reports that he does not drink alcohol and does not use drugs. ? ? ?Physical Exam: ?There were no vitals taken for this visit.  ?Constitutional:  Alert and oriented, No acute distress. ?HEENT: [place] AT, moist mucus membranes.  Trachea midline, no masses. ?Cardiovascular: No clubbing, cyanosis, or edema. ?Respiratory: Normal respiratory effort, no increased work of breathing. ?Skin: No rashes, bruises or suspicious lesions. ?Neurologic: Grossly intact, no focal deficits, moving all 4 extremities. ?Psychiatric: Normal mood and affect. ? ?Laboratory Data: ?Lab Results  ?Component Value Date  ? CREATININE 1.03 06/27/2016  ? ? ?Urinalysis ? ? ?Pertinent Imaging: ?CLINICAL DATA:  Pelvic lipomatous tumor follow-up. Pelvic mass for 5 ?years.  Follow-up exams. No pain or other symptoms. ?  ?EXAM: ?MRI PELVIS WITHOUT AND WITH CONTRAST ?  ?TECHNIQUE: ?Multiplanar multisequence MR imaging of the pelvis was performed ?both before and after administratio

## 2021-06-01 ENCOUNTER — Encounter: Payer: Self-pay | Admitting: Urology

## 2021-06-01 ENCOUNTER — Ambulatory Visit: Payer: Medicare Other | Admitting: Urology

## 2021-06-01 ENCOUNTER — Ambulatory Visit (INDEPENDENT_AMBULATORY_CARE_PROVIDER_SITE_OTHER): Payer: Medicare Other | Admitting: Urology

## 2021-06-01 VITALS — BP 118/74 | HR 56 | Ht 65.0 in | Wt 170.0 lb

## 2021-06-01 DIAGNOSIS — N401 Enlarged prostate with lower urinary tract symptoms: Secondary | ICD-10-CM | POA: Diagnosis not present

## 2021-06-01 DIAGNOSIS — N138 Other obstructive and reflux uropathy: Secondary | ICD-10-CM | POA: Diagnosis not present

## 2021-06-01 DIAGNOSIS — D179 Benign lipomatous neoplasm, unspecified: Secondary | ICD-10-CM

## 2021-06-01 DIAGNOSIS — Z87898 Personal history of other specified conditions: Secondary | ICD-10-CM

## 2021-06-01 NOTE — Progress Notes (Signed)
? ?06/01/21 ?1:10 PM  ? ?Christian Thompson ?1950-05-13 ?[id number] ? ?Referring provider:  ?Latanya Maudlin, NP ?[address] ?[place],  [place] [address] ?Chief Complaint  ?Patient presents with  ? Other  ? ? ? ? ?HPI: ?Christian Thompson is a 71 y.o.male with a personal history of nephrolithiasis, elevated PSA/possible prostate cancer, BPH with LUTS, bladder stones, gross hematuria, and benign lipomatous neoplasm, who presents today for MRI results.  He is accompanied by his wife today. ? ?He also has a personal history of parkinson's.  ? ?History of elevated PSA/ possible prostate cancer: ?He is s/p prostate biopsy in 08/2012 surgical pathology showed small focus of glands suspicious for prostate cancer at right lateral base. PSA trend as below   ? ?His most recent PSA was 3.51 on 02/05/2021. ? ?History of nephrolithiasis: ?He underwent a CT abdomen and pelvis showed completed on 05/28/2015 noted 2 left distal ureteral stones measuring up to 4 mm each.  Spontaneous passage of stones.   RUS completed on 06/22/2015 noted resolution of the left-sided hydronephrosis and a right renal cyst.   Stone composition is unknown.  KUB taken on 06/20/2016 noted no nephrolithiasis.  He denies any further flank pain or stone episodes. ? ?History of gross hematuria:  ?He underwent hematuria work-up in 06/2016 that was negative.  Denies any gross hematuria today or over the past year. ? ?Benign lipomatous neoplasm: ?He was incidentally found to have  increasingly conspicuous predominantly fatty attenuation lesion measuring 6.6 x 4.9 x 5.7 cm in the right hemipelvis extending through the right obturator foramen on 06/2016. 2018 MRI was most consistent with a simple lipoma versus less likely atypical lipoma/ MRI in 2019 was stable he had flight interval decrease in MRI in 2020 and slight interval increase in size on 2022 MRI.  ? ?MRI on 04/30/2021 visualized no significant interval change. No suspicious osseous lesion or  lymphadenopathy. Enlarged prostate which protrudes into the base of the bladder with trabeculated urinary bladder suggestive of chronic outlet obstruction. ? ?BPH with LUTS/ history of bladder stones:  ?He is s/p cystolitholapaxy by Dr Ernst Spell  ? ?Overall his urinary symptoms are stable besides occasional frequency/ urgency.  This is primarily in the evening time.  Otherwise, he has no specific issues.  He sleeps through the night.  He is not on any bladder or BPH medication. ? ? ?PSA trend:  ?07/24/2012  2.1 ?08/2012 prostate biopsy - small focus of glands suspicious for prostate cancer, right lateral base ?07/24/2013  2.38 ?01/20/14  2.28 ?5/17 2.9 ?05/2016 2.58  --> started finasteride shortly thereafter ?12/19 2.1   ?1/21  3.0  No longer on finasteride ?6/21  2.2 ?07/2020 3.67 ?01/2021 3.51 ? ? ? ?PMH: ?Past Medical History:  ?Diagnosis Date  ? Acid reflux 03/07/2011  ? Arteriosclerosis of coronary artery 03/07/2011  ? Overview:  Non ST-elevation myocardial infarction.  Status post coronary artery bypass grafting x4 with left internal mammary artery to left anterior descending artery, saphenous vein graft to posterior descending artery, obtuse marginal 2 and obtuse marginal 1 individually on 02/10/2011, by [facility].   ? Bilateral carpal tunnel syndrome   ? BPH (benign prostatic hyperplasia)   ? Carpal tunnel syndrome 03/26/2014  ? Coronary artery disease   ? Diverticulosis   ? Dystonia   ? Erosive esophagitis   ? 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    ? GERD (gastroesophageal reflux disease)   ? History  of hiatal hernia   ? History of kidney stones   ? HLD (hyperlipidemia) 04/29/2011  ? Overview:  Formatting of this note may be different from the original. Component     Latest Ref Rng 02/10/2011 05/02/2011  Cholesterol, Total      171 114  Triglyceride      64 111  HDL Cholesterol      46 50  Low Density Lipoprotein      105 42    ? Hyperlipidemia   ? Hypertension   ? IGT (impaired  glucose tolerance)   ? Lipids serum increased   ? Parkinson disease ([place])   ? Parkinson's disease ([place]) 03/07/2011  ? Renal disorder   ? Restless leg 03/21/2012  ? RLS (restless legs syndrome)   ? RLS (restless legs syndrome)   ? ? ?Surgical History: ?Past Surgical History:  ?Procedure Laterality Date  ? CARDIAC CATHETERIZATION    ? COLONOSCOPY WITH PROPOFOL N/A 09/29/2014  ? Procedure: COLONOSCOPY WITH PROPOFOL;  Surgeon: [name], MD;  Location: [facility] ENDOSCOPY;  Service: Endoscopy;  Laterality: N/A;  ? COLONOSCOPY WITH PROPOFOL N/A 12/25/2017  ? Procedure: COLONOSCOPY WITH PROPOFOL;  Surgeon: [name], MD;  Location: [facility] ENDOSCOPY;  Service: Endoscopy;  Laterality: N/A;  ? CORONARY ARTERY BYPASS GRAFT    ? ESOPHAGOGASTRODUODENOSCOPY (EGD) WITH PROPOFOL N/A 09/29/2014  ? Procedure: ESOPHAGOGASTRODUODENOSCOPY (EGD) WITH PROPOFOL;  Surgeon: [name], MD;  Location: [facility] ENDOSCOPY;  Service: Endoscopy;  Laterality: N/A;  ? ESOPHAGOGASTRODUODENOSCOPY (EGD) WITH PROPOFOL N/A 06/26/2015  ? Procedure: ESOPHAGOGASTRODUODENOSCOPY (EGD) WITH PROPOFOL;  Surgeon: [name], MD;  Location: [facility] ENDOSCOPY;  Service: Endoscopy;  Laterality: N/A;  ? HAND SURGERY    ? hemmorrhoidectomy    ? HERNIA REPAIR    ? LITHOTRIPSY    ? NEUROPLASTY / TRANSPOSITION MEDIAN NERVE AT CARPAL TUNNEL Left   ? ? ?Home Medications:  ?Allergies as of 06/01/2021   ? ?   Reactions  ? Penicillins   ? Tramadol   ? ?  ? ?  ?Medication List  ?  ? ?  ? Accurate as of June 01, 2021  1:10 PM. If you have any questions, ask your nurse or doctor.  ?  ?  ? ?  ? ?amitriptyline 10 MG tablet ?Commonly known as: ELAVIL ?Take 10 mg by mouth at bedtime. ?  ?atorvastatin 80 MG tablet ?Commonly known as: LIPITOR ?Take 80 mg by mouth daily. ?  ?benzonatate 100 MG capsule ?Commonly known as: TESSALON ?Take by mouth. ?  ?carbidopa-levodopa 25-100 MG tablet ?Commonly known as: SINEMET IR ?Take 1 tablet by mouth 3 (three) times daily. ?  ?citalopram  10 MG tablet ?Commonly known as: CELEXA ?Take 10 mg by mouth daily. ?  ?cyanocobalamin 1000 MCG tablet ?Take 100 mcg by mouth daily. ?  ?GaviLyte-N with Flavor Pack 420 g solution ?Generic drug: polyethylene glycol-electrolytes ?TAKE 4,000 MLS BY MOUTH AS DIRECTED ?  ?glucosamine-chondroitin 500-400 MG tablet ?Take 1 tablet by mouth 3 (three) times daily. ?  ?hyoscyamine 0.375 MG 12 hr tablet ?Commonly known as: LEVBID ?Take by mouth. ?  ?lisinopril 5 MG tablet ?Commonly known as: ZESTRIL ?Take 5 mg by mouth daily. ?  ?metoprolol succinate 25 MG 24 hr tablet ?Commonly known as: TOPROL-XL ?Take 25 mg by mouth daily. ?  ?multivitamin tablet ?Take 1 tablet by mouth daily. ?  ?mupirocin ointment 2 % ?Commonly known as: BACTROBAN ?Apply two times a day for 7 days. ?  ?nitroGLYCERIN 0.4 MG  SL tablet ?Commonly known as: NITROSTAT ?Place 0.4 mg under the tongue every 5 (five) minutes as needed for chest pain. ?  ?omega-3 acid ethyl esters 1 g capsule ?Commonly known as: LOVAZA ?Take by mouth 2 (two) times daily. ?  ?pantoprazole 40 MG tablet ?Commonly known as: PROTONIX ?Take 40 mg by mouth daily. ?  ?rOPINIRole 0.5 MG tablet ?Commonly known as: REQUIP ?Take 0.5 mg by mouth 3 (three) times daily. ?  ?triamcinolone 55 MCG/ACT Aero nasal inhaler ?Commonly known as: NASACORT ?Place 2 sprays into the nose daily. ?  ?vitamin C 500 MG tablet ?Commonly known as: ASCORBIC ACID ?Take 500 mg by mouth daily. ?  ? ?  ? ? ?Allergies:  ?Allergies  ?Allergen Reactions  ? Penicillins   ? Tramadol   ? ? ?Family History: ?Family History  ?Problem Relation Age of Onset  ? Asthma Mother   ? Emphysema Mother   ? Alzheimer's disease Father   ? ? ?Social History:  reports that he has never smoked. He quit smokeless tobacco use about 14 years ago.  His smokeless tobacco use included chew. He reports that he does not drink alcohol and does not use drugs. ? ? ?Physical Exam: ?BP 118/74   Pulse (!) 56   Ht '5\' 5"'$  (1.651 m)   Wt 170 lb (77.1 kg)    BMI 28.29 kg/m?   ?Constitutional:  Alert and oriented, No acute distress. ?HEENT: [place] AT, moist mucus membranes.  Trachea midline, no masses. ?Cardiovascular: No clubbing, cyanosis, or edema. ?Respiratory: Normal resp

## 2022-05-25 ENCOUNTER — Other Ambulatory Visit: Payer: Self-pay

## 2022-05-25 DIAGNOSIS — Z87898 Personal history of other specified conditions: Secondary | ICD-10-CM

## 2022-05-25 DIAGNOSIS — N138 Other obstructive and reflux uropathy: Secondary | ICD-10-CM

## 2022-05-26 ENCOUNTER — Other Ambulatory Visit: Payer: Medicare Other

## 2022-05-26 DIAGNOSIS — Z87898 Personal history of other specified conditions: Secondary | ICD-10-CM

## 2022-05-26 DIAGNOSIS — N138 Other obstructive and reflux uropathy: Secondary | ICD-10-CM

## 2022-05-27 LAB — PSA: Prostate Specific Ag, Serum: 3.5 ng/mL (ref 0.0–4.0)

## 2022-06-01 ENCOUNTER — Ambulatory Visit (INDEPENDENT_AMBULATORY_CARE_PROVIDER_SITE_OTHER): Payer: Medicare Other | Admitting: Urology

## 2022-06-01 DIAGNOSIS — R1900 Intra-abdominal and pelvic swelling, mass and lump, unspecified site: Secondary | ICD-10-CM | POA: Diagnosis not present

## 2022-06-01 DIAGNOSIS — R339 Retention of urine, unspecified: Secondary | ICD-10-CM

## 2022-06-01 DIAGNOSIS — N401 Enlarged prostate with lower urinary tract symptoms: Secondary | ICD-10-CM

## 2022-06-01 DIAGNOSIS — R972 Elevated prostate specific antigen [PSA]: Secondary | ICD-10-CM | POA: Diagnosis not present

## 2022-06-01 DIAGNOSIS — N138 Other obstructive and reflux uropathy: Secondary | ICD-10-CM

## 2022-06-01 DIAGNOSIS — N200 Calculus of kidney: Secondary | ICD-10-CM | POA: Diagnosis not present

## 2022-06-01 DIAGNOSIS — G20A1 Parkinson's disease without dyskinesia, without mention of fluctuations: Secondary | ICD-10-CM

## 2022-06-01 DIAGNOSIS — Z87898 Personal history of other specified conditions: Secondary | ICD-10-CM

## 2022-06-01 LAB — BLADDER SCAN AMB NON-IMAGING: Scan Result: 110

## 2022-06-01 NOTE — Progress Notes (Signed)
Marcelle Overlie Plume,acting as a scribe for Vanna Scotland, MD.,have documented all relevant documentation on the behalf of Vanna Scotland, MD,as directed by  Vanna Scotland, MD while in the presence of Vanna Scotland, MD.  06/01/2022 2:32 PM   Carolynn Serve [birth year] [id number]  Referring provider: Luciana Axe, NP [address] [place],  [place] [address]  Chief Complaint  Patient presents with   Follow-up   Elevated PSA    HPI: 72 year-old male with multiple issues, including a personal history of nepholithiasis, elevated PSA, BPH with LUTS, bladder stones, gross hematuria, and pelvic lipoma. He returns today for annual follow up.   In terms of elevated PSA, he is status post biopsy in 2014. He had a small focus that was suspicious at the right lateral base at the time.   He has not had cross sectional imaging regarding his nephrolithiasis. He has not required surgical intervention.   He is also being followed for what is felt to be a benign lipomatous mass in the right hemipelvis. It was last imaged in 04/2021 and was stable in size measuring 8.5 cm at the largest diameter. It was unchanged for many years and we had elected to forego additional surveillance imaging given its overall stability. He is due for imaging next year.   He is not currently on any BPH medications.  His most recent PSA on 05/26/2022 is stable at 3.5.   Today,  he mentions a possible kidney stone passage within the last year, indicated by a brief episode of stinging, but did not seek medical attention for this.  He does have a personal history of Parkinson's which may be contributing to his urinary symptoms.  Overall, he is fairly pleased with the symptoms.  IPSS as below.  PVR is mildly elevated at 110 today.  PSA trend:  07/24/2012  2.1 08/2012 prostate biopsy - small focus of glands suspicious for prostate cancer, right lateral base 07/24/2013  2.38 01/20/14  2.28 5/17 2.9 05/2016 2.58  -->  started finasteride shortly thereafter 12/19 2.1   1/21  3.0  No longer on finasteride 6/21  2.2 07/2020 3.67 01/2021 3.51 05/2022 3.5    Results for orders placed or performed in visit on 06/01/22  BLADDER SCAN AMB NON-IMAGING  Result Value Ref Range   Scan Result 110 ml     IPSS     Row Name 06/01/22 1000         International Prostate Symptom Score   How often have you had the sensation of not emptying your bladder? Less than half the time     How often have you had to urinate less than every two hours? Less than half the time     How often have you found you stopped and started again several times when you urinated? Less than 1 in 5 times     How often have you found it difficult to postpone urination? Less than half the time     How often have you had a weak urinary stream? Less than half the time     How often have you had to strain to start urination? Less than 1 in 5 times     How many times did you typically get up at night to urinate? None     Total IPSS Score 10       Quality of Life due to urinary symptoms   If you were to spend the rest of your life  with your urinary condition just the way it is now how would you feel about that? Mostly Satisfied              Score:  1-7 Mild 8-19 Moderate 20-35 Severe    PMH: Past Medical History:  Diagnosis Date   Acid reflux 03/07/2011   Arteriosclerosis of coronary artery 03/07/2011   Overview:  Non ST-elevation myocardial infarction.  Status post coronary artery bypass grafting x4 with left internal mammary artery to left anterior descending artery, saphenous vein graft to posterior descending artery, obtuse marginal 2 and obtuse marginal 1 individually on 02/10/2011, by [facility].    Bilateral carpal tunnel syndrome    BPH (benign prostatic hyperplasia)    Carpal tunnel syndrome 03/26/2014   Coronary artery disease    Diverticulosis    Dystonia    Erosive esophagitis    Focal dystonia  03/12/2014   Overview:  Secondary to PD.     GERD (gastroesophageal reflux disease)    History of hiatal hernia    History of kidney stones    HLD (hyperlipidemia) 04/29/2011   Overview:  Formatting of this note may be different from the original. Component     Latest Ref Rng 02/10/2011 05/02/2011  Cholesterol, Total      171 114  Triglyceride      64 111  HDL Cholesterol      46 50  Low Density Lipoprotein      105 42     Hyperlipidemia    Hypertension    IGT (impaired glucose tolerance)    Lipids serum increased    Parkinson disease (HCC)    Parkinson's disease (HCC) 03/07/2011   Renal disorder    Restless leg 03/21/2012   RLS (restless legs syndrome)    RLS (restless legs syndrome)     Surgical History: Past Surgical History:  Procedure Laterality Date   CARDIAC CATHETERIZATION     COLONOSCOPY WITH PROPOFOL N/A 09/29/2014   Procedure: COLONOSCOPY WITH PROPOFOL;  Surgeon: Christena Deem, MD;  Location: [facility] ENDOSCOPY;  Service: Endoscopy;  Laterality: N/A;   COLONOSCOPY WITH PROPOFOL N/A 12/25/2017   Procedure: COLONOSCOPY WITH PROPOFOL;  Surgeon: Christena Deem, MD;  Location: [facility] ENDOSCOPY;  Service: Endoscopy;  Laterality: N/A;   CORONARY ARTERY BYPASS GRAFT     ESOPHAGOGASTRODUODENOSCOPY (EGD) WITH PROPOFOL N/A 09/29/2014   Procedure: ESOPHAGOGASTRODUODENOSCOPY (EGD) WITH PROPOFOL;  Surgeon: Christena Deem, MD;  Location: [facility] ENDOSCOPY;  Service: Endoscopy;  Laterality: N/A;   ESOPHAGOGASTRODUODENOSCOPY (EGD) WITH PROPOFOL N/A 06/26/2015   Procedure: ESOPHAGOGASTRODUODENOSCOPY (EGD) WITH PROPOFOL;  Surgeon: Christena Deem, MD;  Location: [facility] ENDOSCOPY;  Service: Endoscopy;  Laterality: N/A;   HAND SURGERY     hemmorrhoidectomy     HERNIA REPAIR     LITHOTRIPSY     NEUROPLASTY / TRANSPOSITION MEDIAN NERVE AT CARPAL TUNNEL Left     Home Medications:  Allergies as of 06/01/2022       Reactions   Penicillins    Tramadol         Medication List        Accurate  as of June 01, 2022  2:32 PM. If you have any questions, ask your nurse or doctor.          amitriptyline 10 MG tablet Commonly known as: ELAVIL Take 10 mg by mouth at bedtime.   ascorbic acid 500 MG tablet Commonly known as: VITAMIN C Take 500 mg by mouth daily.  atorvastatin 80 MG tablet Commonly known as: LIPITOR Take 80 mg by mouth daily.   benzonatate 100 MG capsule Commonly known as: TESSALON Take by mouth.   carbidopa-levodopa 25-100 MG tablet Commonly known as: SINEMET IR Take 1 tablet by mouth 3 (three) times daily.   citalopram 10 MG tablet Commonly known as: CELEXA Take 10 mg by mouth daily.   cyanocobalamin 1000 MCG tablet Take 100 mcg by mouth daily.   GaviLyte-N with Flavor Pack 420 g solution Generic drug: polyethylene glycol-electrolytes TAKE 4,000 MLS BY MOUTH AS DIRECTED   glucosamine-chondroitin 500-400 MG tablet Take 1 tablet by mouth 3 (three) times daily.   hyoscyamine 0.375 MG 12 hr tablet Commonly known as: LEVBID Take by mouth.   lisinopril 5 MG tablet Commonly known as: ZESTRIL Take 5 mg by mouth daily.   metoprolol succinate 25 MG 24 hr tablet Commonly known as: TOPROL-XL Take 25 mg by mouth daily.   multivitamin tablet Take 1 tablet by mouth daily.   mupirocin ointment 2 % Commonly known as: BACTROBAN Apply two times a day for 7 days.   nitroGLYCERIN 0.4 MG SL tablet Commonly known as: NITROSTAT Place 0.4 mg under the tongue every 5 (five) minutes as needed for chest pain.   omega-3 acid ethyl esters 1 g capsule Commonly known as: LOVAZA Take by mouth 2 (two) times daily.   pantoprazole 40 MG tablet Commonly known as: PROTONIX Take 40 mg by mouth daily.   rOPINIRole 0.5 MG tablet Commonly known as: REQUIP Take 0.5 mg by mouth 3 (three) times daily.   triamcinolone 55 MCG/ACT Aero nasal inhaler Commonly known as: NASACORT Place 2 sprays into the nose daily.        Allergies:  Allergies  Allergen  Reactions   Penicillins    Tramadol     Family History: Family History  Problem Relation Age of Onset   Asthma Mother    Emphysema Mother    Alzheimer's disease Father     Social History:  reports that he has never smoked. He quit smokeless tobacco use about 15 years ago.  His smokeless tobacco use included chew. He reports that he does not drink alcohol and does not use drugs.   Physical Exam: There were no vitals taken for this visit.  Constitutional:  Alert and oriented, No acute distress.   HEENT: [place] AT, moist mucus membranes.  Trachea midline, no masses. Neurologic: Grossly intact, no focal deficits, moving all 4 extremities.  Left upper extremity tremor appreciated. Psychiatric: Normal mood and affect.  Assessment & Plan:    1. Elevated PSA - Status post biopsy in 2014. He had a small focus that was suspicious at the right lateral base at the time. - His most recent PSA on 05/26/2022 is stable at 3.5. - DRE was deferred today in light of his additional medical comorbidities and PSA stability  2. Nephrolithiasis - He has not had any interval cross sectional imaging - He mentions a possible kidney stone passage within the last year, indicated by a brief episode of stinging, but did not seek medical attention for this. -Currently asymptomatic  3. Pelvic mass in male - Last imaged in 04/2021 and was stable in size measuring 8.5 cm at the largest diameter. It was unchanged for many years, likely benign - Pelvic MRI with contrast next year to continue 2 year surveillance plan; stable at this point would recommend foregoing additional MRIs  4. BPH with LUTs - Not currently on any BPH medications -  Borderline incomplete bladder emptying, will continue to monitor  5. Parkinson's disease - May be contributing to his urinary symptoms  Return in about 1 year (around 06/01/2023) for IPSS, PVR, PSA, and pelvic MRI with contrast.  I have reviewed the above documentation for  accuracy and completeness, and I agree with the above.   Vanna ScotlandAshley Cheyenne Bordeaux, MD   [facility] [address], [address] [place], [place] [address] [phone]

## 2023-03-29 ENCOUNTER — Telehealth: Payer: Self-pay | Admitting: Urology

## 2023-03-29 NOTE — Telephone Encounter (Signed)
 This will need to go to [facility] who does auth. I am sending to [facility]. Charlene please communicate back to patient or his wife about this.

## 2023-03-29 NOTE — Telephone Encounter (Signed)
 Pt wife called regarding his mri that is scheduled for 04/22/2023. Pt now has new insurance [employer]). They are bringing card into office today. Does he need a new prior auth for the mri and would it be possible to keep the same date? Please advise

## 2023-04-04 ENCOUNTER — Encounter: Payer: Self-pay | Admitting: *Deleted

## 2023-04-11 ENCOUNTER — Encounter: Payer: Self-pay | Admitting: *Deleted

## 2023-04-11 ENCOUNTER — Ambulatory Visit: Payer: Medicare HMO | Admitting: Certified Registered"

## 2023-04-11 ENCOUNTER — Encounter
Admission: RE | Disposition: A | Discharge status: Home / Self Care | Payer: Self-pay | Source: Ambulatory Visit | Attending: Gastroenterology

## 2023-04-11 ENCOUNTER — Ambulatory Visit
Admission: RE | Admit: 2023-04-11 | Discharge: 2023-04-11 | Disposition: A | Discharge status: Home / Self Care | Payer: Medicare HMO | Source: Ambulatory Visit | Attending: Gastroenterology | Admitting: Gastroenterology

## 2023-04-11 DIAGNOSIS — I2510 Atherosclerotic heart disease of native coronary artery without angina pectoris: Secondary | ICD-10-CM | POA: Insufficient documentation

## 2023-04-11 DIAGNOSIS — K621 Rectal polyp: Secondary | ICD-10-CM | POA: Insufficient documentation

## 2023-04-11 DIAGNOSIS — I10 Essential (primary) hypertension: Secondary | ICD-10-CM | POA: Diagnosis not present

## 2023-04-11 DIAGNOSIS — F418 Other specified anxiety disorders: Secondary | ICD-10-CM | POA: Insufficient documentation

## 2023-04-11 DIAGNOSIS — K5730 Diverticulosis of large intestine without perforation or abscess without bleeding: Secondary | ICD-10-CM | POA: Diagnosis not present

## 2023-04-11 DIAGNOSIS — K219 Gastro-esophageal reflux disease without esophagitis: Secondary | ICD-10-CM | POA: Insufficient documentation

## 2023-04-11 DIAGNOSIS — Z8719 Personal history of other diseases of the digestive system: Secondary | ICD-10-CM | POA: Diagnosis not present

## 2023-04-11 DIAGNOSIS — I252 Old myocardial infarction: Secondary | ICD-10-CM | POA: Diagnosis not present

## 2023-04-11 DIAGNOSIS — Z951 Presence of aortocoronary bypass graft: Secondary | ICD-10-CM | POA: Diagnosis not present

## 2023-04-11 DIAGNOSIS — Z1211 Encounter for screening for malignant neoplasm of colon: Secondary | ICD-10-CM | POA: Diagnosis present

## 2023-04-11 DIAGNOSIS — Z955 Presence of coronary angioplasty implant and graft: Secondary | ICD-10-CM | POA: Diagnosis not present

## 2023-04-11 DIAGNOSIS — Z8711 Personal history of peptic ulcer disease: Secondary | ICD-10-CM | POA: Insufficient documentation

## 2023-04-11 HISTORY — DX: Localized edema: R60.0

## 2023-04-11 HISTORY — DX: Anxiety disorder, unspecified: F41.9

## 2023-04-11 HISTORY — PX: COLONOSCOPY WITH PROPOFOL: SHX5780

## 2023-04-11 HISTORY — DX: Other specified abnormal findings of blood chemistry: R79.89

## 2023-04-11 HISTORY — DX: Adjustment disorder with depressed mood: F43.21

## 2023-04-11 HISTORY — DX: Acute myocardial infarction, unspecified: I21.9

## 2023-04-11 HISTORY — DX: Intra-abdominal and pelvic swelling, mass and lump, unspecified site: R19.00

## 2023-04-11 HISTORY — DX: Type 2 diabetes mellitus without complications: E11.9

## 2023-04-11 LAB — GLUCOSE, CAPILLARY: Glucose-Capillary: 141 mg/dL — ABNORMAL HIGH (ref 70–99)

## 2023-04-11 SURGERY — COLONOSCOPY WITH PROPOFOL
Anesthesia: General

## 2023-04-11 MED ORDER — EPHEDRINE 5 MG/ML INJ
INTRAVENOUS | Status: AC
  Filled 2023-04-11: qty 5

## 2023-04-11 MED ORDER — PROPOFOL 10 MG/ML IV BOLUS
INTRAVENOUS | Status: DC | PRN
  Administered 2023-04-11: 100 mg via INTRAVENOUS
  Administered 2023-04-11: 150 ug/kg/min via INTRAVENOUS

## 2023-04-11 MED ORDER — LIDOCAINE HCL (CARDIAC) PF 100 MG/5ML IV SOSY
PREFILLED_SYRINGE | INTRAVENOUS | Status: DC | PRN
  Administered 2023-04-11: 100 mg via INTRAVENOUS

## 2023-04-11 MED ORDER — EPHEDRINE SULFATE-NACL 50-0.9 MG/10ML-% IV SOSY
PREFILLED_SYRINGE | INTRAVENOUS | Status: DC | PRN
  Administered 2023-04-11 (×2): 5 mg via INTRAVENOUS

## 2023-04-11 MED ORDER — PHENYLEPHRINE 80 MCG/ML (10ML) SYRINGE FOR IV PUSH (FOR BLOOD PRESSURE SUPPORT)
PREFILLED_SYRINGE | INTRAVENOUS | Status: DC | PRN
  Administered 2023-04-11: 80 ug via INTRAVENOUS

## 2023-04-11 MED ORDER — SODIUM CHLORIDE 0.9 % IV SOLN: INTRAVENOUS | Status: DC

## 2023-04-11 MED ORDER — PROPOFOL 10 MG/ML IV BOLUS
INTRAVENOUS | Status: AC
  Filled 2023-04-11: qty 40

## 2023-04-11 NOTE — H&P (Signed)
Outpatient short stay form Pre-procedure 04/11/2023  Christian Bill, MD  Primary Physician: Luciana Axe, NP  Reason for visit:  Surveillance  History of present illness:    73 y/o gentleman with history of hypertension, HLD, and Parkinson's disease here for colonoscopy for history of TA on last colonoscopy in 2019. No blood thinners except aspirin. No significant abdominal surgeries. No family history of GI malignancies.    Current Facility-Administered Medications:    0.9 %  sodium chloride infusion, , Intravenous, Continuous, Valon Glasscock, Rossie Muskrat, MD, Last Rate: 20 mL/hr at 04/11/23 0807, New Bag at 04/11/23 0807  Medications Prior to Admission  Medication Sig Dispense Refill Last Dose/Taking   carbidopa-levodopa (SINEMET IR) 25-100 MG per tablet Take 1 tablet by mouth 3 (three) times daily.   04/11/2023 Morning   lisinopril (PRINIVIL,ZESTRIL) 5 MG tablet Take 5 mg by mouth daily.   04/11/2023 Morning   metFORMIN (GLUCOPHAGE-XR) 500 MG 24 hr tablet Take 500 mg by mouth daily with breakfast.   Past Week   amitriptyline (ELAVIL) 10 MG tablet Take 10 mg by mouth at bedtime.      atorvastatin (LIPITOR) 80 MG tablet Take 80 mg by mouth daily.      benzonatate (TESSALON) 100 MG capsule Take by mouth.      citalopram (CELEXA) 10 MG tablet Take 10 mg by mouth daily.      cyanocobalamin 1000 MCG tablet Take 100 mcg by mouth daily.      GAVILYTE-N WITH FLAVOR PACK 420 g solution TAKE 4,000 MLS BY MOUTH AS DIRECTED  0    glucosamine-chondroitin 500-400 MG tablet Take 1 tablet by mouth 3 (three) times daily.      hyoscyamine (LEVBID) 0.375 MG 12 hr tablet Take by mouth.      metoprolol succinate (TOPROL-XL) 25 MG 24 hr tablet Take 25 mg by mouth daily.      Multiple Vitamin (MULTIVITAMIN) tablet Take 1 tablet by mouth daily.      mupirocin ointment (BACTROBAN) 2 % Apply two times a day for 7 days. 22 g 0    nitroGLYCERIN (NITROSTAT) 0.4 MG SL tablet Place 0.4 mg under the tongue every 5  (five) minutes as needed for chest pain.      omega-3 acid ethyl esters (LOVAZA) 1 G capsule Take by mouth 2 (two) times daily.      pantoprazole (PROTONIX) 40 MG tablet Take 40 mg by mouth daily.      rOPINIRole (REQUIP) 0.5 MG tablet Take 0.5 mg by mouth 3 (three) times daily.      triamcinolone (NASACORT) 55 MCG/ACT AERO nasal inhaler Place 2 sprays into the nose daily.      vitamin C (ASCORBIC ACID) 500 MG tablet Take 500 mg by mouth daily.        Allergies  Allergen Reactions   Penicillins    Tramadol      Past Medical History:  Diagnosis Date   Acid reflux 03/07/2011   Anxiety    Arteriosclerosis of coronary artery 03/07/2011   Overview:  Non ST-elevation myocardial infarction.  Status post coronary artery bypass grafting x4 with left internal mammary artery to left anterior descending artery, saphenous vein graft to posterior descending artery, obtuse marginal 2 and obtuse marginal 1 individually on 02/10/2011, by [facility].    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     GERD (gastroesophageal reflux disease)    History of hiatal hernia    History of kidney stones    HLD (hyperlipidemia) 04/29/2011   Overview:  Formatting of this note may be different from the original. Component     Latest Ref Rng 02/10/2011 05/02/2011  Cholesterol, Total      171 114  Triglyceride      64 111  HDL Cholesterol      46 50  Low Density Lipoprotein      105 42     Hyperlipidemia    Hypertension    IGT (impaired glucose tolerance)    Lipids serum increased    Low vitamin D level    Myocardial infarction (HCC)    Parkinson's disease (HCC) 03/07/2011   Pelvic mass    Peripheral edema    Renal disorder    Restless  leg 03/21/2012   RLS (restless legs syndrome)    Situational depression     Review of systems:  Otherwise negative.    Physical Exam  Gen: Alert, oriented. Appears stated age.  HEENT: [place]. Lungs: No respiratory distress CV: RRR Abd: soft, benign, no masses Ext: No edema    Planned procedures: Proceed with colonoscopy. The patient understands the nature of the planned procedure, indications, risks, alternatives and potential complications including but not limited to bleeding, infection, perforation, damage to internal organs and possible oversedation/side effects from anesthesia. The patient agrees and gives consent to proceed.  Please refer to procedure notes for findings, recommendations and patient disposition/instructions.     Christian Bill, MD [facility]

## 2023-04-11 NOTE — Op Note (Signed)
[facility] Gastroenterology Patient Name: Christian Thompson Procedure Date: 04/11/2023 8:20 AM MRN: [id number] Account #: [id number] Date of Birth: 10-30-1950 Admit Type: Outpatient Age: 73 Room: [facility] ENDO ROOM 1 Gender: Male Note Status: Finalized Instrument Name: Prentice Docker [id number] Procedure:             Colonoscopy Indications:           Surveillance: Personal history of adenomatous polyps                         on last colonoscopy > 5 years ago Providers:             Eather Colas MD, MD Referring MD:          no local MD, Laren Everts, NP Medicines:             Monitored Anesthesia Care Complications:         No immediate complications. Estimated blood loss:                         Minimal. Procedure:             Pre-Anesthesia Assessment:                        - Prior to the procedure, a History and Physical was                         performed, and patient medications and allergies were                         reviewed. The patient is competent. The risks and                         benefits of the procedure and the sedation options and                         risks were discussed with the patient. All questions                         were answered and informed consent was obtained.                         Patient identification and proposed procedure were                         verified by the physician, the nurse, the                         anesthesiologist, the anesthetist and the technician                         in the endoscopy suite. Mental Status Examination:                         alert and oriented. Airway Examination: normal                         oropharyngeal airway and neck mobility. Respiratory  Examination: clear to auscultation. CV Examination:                         normal. Prophylactic Antibiotics: The patient does not                         require prophylactic antibiotics. Prior                          Anticoagulants: The patient has taken no anticoagulant                         or antiplatelet agents. ASA Grade Assessment: III - A                         patient with severe systemic disease. After reviewing                         the risks and benefits, the patient was deemed in                         satisfactory condition to undergo the procedure. The                         anesthesia plan was to use monitored anesthesia care                         (MAC). Immediately prior to administration of                         medications, the patient was re-assessed for adequacy                         to receive sedatives. The heart rate, respiratory                         rate, oxygen saturations, blood pressure, adequacy of                         pulmonary ventilation, and response to care were                         monitored throughout the procedure. The physical                         status of the patient was re-assessed after the                         procedure.                        After obtaining informed consent, the colonoscope was                         passed under direct vision. Throughout the procedure,                         the patient's blood pressure, pulse, and oxygen  saturations were monitored continuously. The                         Colonoscope was introduced through the anus and                         advanced to the the cecum, identified by appendiceal                         orifice and ileocecal valve. The colonoscopy was                         performed without difficulty. The patient tolerated                         the procedure well. The quality of the bowel                         preparation was adequate to identify polyps. The                         ileocecal valve, appendiceal orifice, and rectum were                         photographed. Findings:      The perianal and digital rectal examinations were  normal.      A few small-mouthed diverticula were found in the sigmoid colon and       descending colon.      Two sessile polyps were found in the recto-sigmoid colon. The polyps       were 2 to 3 mm in size. These polyps were removed with a cold snare.       Resection and retrieval were complete. Estimated blood loss was minimal.      Retroflexion in the rectum was not performed due to small rectal vault. Impression:            - Diverticulosis in the sigmoid colon and in the                         descending colon.                        - Two 2 to 3 mm polyps at the recto-sigmoid colon,                         removed with a cold snare. Resected and retrieved. Recommendation:        - Discharge patient to home.                        - Resume previous diet.                        - Continue present medications.                        - Await pathology results.                        - Repeat colonoscopy is not recommended due to current  age (1 years or older) for surveillance.                        - Return to referring physician as previously                         scheduled. Procedure Code(s):     --- Professional ---                        [phone], Colonoscopy, flexible; with removal of                         tumor(s), polyp(s), or other lesion(s) by snare                         technique Diagnosis Code(s):     --- Professional ---                        Z86.010, Personal history of colonic polyps                        D12.7, Benign neoplasm of rectosigmoid junction                        K57.30, Diverticulosis of large intestine without                         perforation or abscess without bleeding CPT copyright 2022 [employer]. All rights reserved. The codes documented in this report are preliminary and upon coder review may  be revised to meet current compliance requirements. Eather Colas MD, MD 04/11/2023 8:52:11 AM Number  of Addenda: 0 Note Initiated On: 04/11/2023 8:20 AM Scope Withdrawal Time: 0 hours 8 minutes 52 seconds  Total Procedure Duration: 0 hours 12 minutes 14 seconds  Estimated Blood Loss:  Estimated blood loss was minimal.      [facility]

## 2023-04-11 NOTE — Interval H&P Note (Signed)
History and Physical Interval Note:  04/11/2023 8:28 AM  Christian Thompson  has presented today for surgery, with the diagnosis of hx of adenomatous polyp of colon.  The various methods of treatment have been discussed with the patient and family. After consideration of risks, benefits and other options for treatment, the patient has consented to  Procedure(s): COLONOSCOPY WITH PROPOFOL (N/A) as a surgical intervention.  The patient's history has been reviewed, patient examined, no change in status, stable for surgery.  I have reviewed the patient's chart and labs.  Questions were answered to the patient's satisfaction.     Regis Bill  Ok to proceed with colonoscopy

## 2023-04-11 NOTE — Anesthesia Preprocedure Evaluation (Signed)
Anesthesia Evaluation  Patient identified by MRN, date of birth, ID band Patient awake    Reviewed: Allergy & Precautions, H&P , NPO status , Patient's Chart, lab work & pertinent test results  History of Anesthesia Complications Negative for: history of anesthetic complications  Airway Mallampati: III  TM Distance: <3 FB Neck ROM: full    Dental  (+) Chipped   Pulmonary neg pulmonary ROS, neg shortness of breath          Cardiovascular Exercise Tolerance: Good hypertension, (-) angina + CAD, + Past MI, + Cardiac Stents and + CABG  (-) DOE (-) dysrhythmias (-) Valvular Problems/Murmurs     Neuro/Psych neg Seizures PSYCHIATRIC DISORDERS Anxiety Depression     Neuromuscular disease    GI/Hepatic Neg liver ROS, hiatal hernia, PUD,GERD  Medicated and Controlled,,  Endo/Other  negative endocrine ROSdiabetes    Renal/GU Renal disease  negative genitourinary   Musculoskeletal   Abdominal   Peds  Hematology negative hematology ROS (+)   Anesthesia Other Findings Past Medical History: 03/07/2011: Acid reflux 03/07/2011: Arteriosclerosis of coronary artery     Comment:  Overview:  Non ST-elevation myocardial infarction.                Status post coronary artery bypass grafting x4 with left               internal mammary artery to left anterior descending               artery, saphenous vein graft to posterior descending               artery, obtuse marginal 2 and obtuse marginal 1               individually on 02/10/2011, by Timmothy Euler, MD and               Associates.  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   No date: GERD (gastroesophageal reflux disease) No date:  History of hiatal hernia No date: History of kidney stones 04/29/2011: HLD (hyperlipidemia)     Comment:  Overview:  Formatting of this note may be different from              the original. Component     Latest Ref Rng 02/10/2011               05/02/2011  Cholesterol, Total      171 114  Triglyceride               64 111  HDL Cholesterol      46 50  Low Density               Lipoprotein      105 42   No date: Hyperlipidemia No date: Hypertension No date: IGT (impaired glucose tolerance) No date: Lipids serum increased No date: Parkinson disease (HCC) 03/07/2011: Parkinson's disease (HCC) No date: Renal disorder 03/21/2012: Restless leg No date: RLS (restless legs syndrome) No date: RLS (restless legs syndrome)  Past Surgical History: No date: CARDIAC CATHETERIZATION 09/29/2014: COLONOSCOPY WITH PROPOFOL; N/A     Comment:  Procedure: COLONOSCOPY WITH PROPOFOL;  Surgeon: Christena Deem,  MD;  Location: [facility] ENDOSCOPY;  Service:               Endoscopy;  Laterality: N/A; No date: CORONARY ARTERY BYPASS GRAFT 09/29/2014: ESOPHAGOGASTRODUODENOSCOPY (EGD) WITH PROPOFOL; N/A     Comment:  Procedure: ESOPHAGOGASTRODUODENOSCOPY (EGD) WITH               PROPOFOL;  Surgeon: Christena Deem, MD;  Location:               [facility] ENDOSCOPY;  Service: Endoscopy;  Laterality: N/A; 06/26/2015: ESOPHAGOGASTRODUODENOSCOPY (EGD) WITH PROPOFOL; N/A     Comment:  Procedure: ESOPHAGOGASTRODUODENOSCOPY (EGD) WITH               PROPOFOL;  Surgeon: Christena Deem, MD;  Location:               [facility] ENDOSCOPY;  Service: Endoscopy;  Laterality: N/A; No date: HAND SURGERY No date: hemmorrhoidectomy No date: HERNIA REPAIR No date: LITHOTRIPSY No date: NEUROPLASTY / TRANSPOSITION MEDIAN NERVE AT CARPAL TUNNEL;  Left  BMI    Body Mass Index:  27.81 kg/m      Reproductive/Obstetrics negative OB ROS                             Anesthesia Physical Anesthesia Plan  ASA:  3  Anesthesia Plan: General   Post-op Pain Management:    Induction: Intravenous  PONV Risk Score and Plan: Propofol infusion and TIVA  Airway Management Planned: Natural Airway and Nasal Cannula  Additional Equipment:   Intra-op Plan:   Post-operative Plan:   Informed Consent: I have reviewed the patients History and Physical, chart, labs and discussed the procedure including the risks, benefits and alternatives for the proposed anesthesia with the patient or authorized representative who has indicated his/her understanding and acceptance.     Dental Advisory Given  Plan Discussed with: Anesthesiologist, CRNA and Surgeon  Anesthesia Plan Comments: (Patient consented for risks of anesthesia including but not limited to:  - adverse reactions to medications - risk of intubation if required - damage to teeth, lips or other oral mucosa - sore throat or hoarseness - Damage to heart, brain, lungs or loss of life  Patient voiced understanding.)        Anesthesia Quick Evaluation

## 2023-04-11 NOTE — Anesthesia Postprocedure Evaluation (Signed)
Anesthesia Post Note  Patient: Christian Thompson  Procedure(s) Performed: COLONOSCOPY WITH PROPOFOL  Patient location during evaluation: Endoscopy Anesthesia Type: General Level of consciousness: awake and alert Pain management: pain level controlled Vital Signs Assessment: post-procedure vital signs reviewed and stable Respiratory status: spontaneous breathing, nonlabored ventilation, respiratory function stable and patient connected to nasal cannula oxygen Cardiovascular status: blood pressure returned to baseline and stable Postop Assessment: no apparent nausea or vomiting Anesthetic complications: no   No notable events documented.   Last Vitals:  Vitals:   04/11/23 0758 04/11/23 0851  BP: 122/71 102/61  Pulse: 72   Resp: 20   Temp: (!) 36.1 C (!) 35.8 C  SpO2: 94% 97%    Last Pain:  Vitals:   04/11/23 0911  TempSrc:   PainSc: 0-No pain                 Lenard Simmer

## 2023-04-11 NOTE — Transfer of Care (Signed)
Immediate Anesthesia Transfer of Care Note  Patient: Christian Thompson  Procedure(s) Performed: COLONOSCOPY WITH PROPOFOL  Patient Location: Endoscopy Unit  Anesthesia Type:General  Level of Consciousness: drowsy  Airway & Oxygen Therapy: Patient Spontanous Breathing  Post-op Assessment: Report given to RN and Post -op Vital signs reviewed and stable  Post vital signs: Reviewed and stable  Last Vitals:  Vitals Value Taken Time  BP 102/61 04/11/23 0853  Temp 35.8 0853  Pulse 73 04/11/23 0853  Resp 21 04/11/23 0853  SpO2 97 % 04/11/23 0853  Vitals shown include unfiled device data.  Last Pain:  Vitals:   04/11/23 0758  TempSrc: Temporal  PainSc: 0-No pain         Complications: No notable events documented.

## 2023-04-12 LAB — SURGICAL PATHOLOGY

## 2023-04-19 ENCOUNTER — Telehealth: Payer: Self-pay | Admitting: Urology

## 2023-04-19 ENCOUNTER — Other Ambulatory Visit: Payer: Self-pay | Admitting: Urology

## 2023-04-19 ENCOUNTER — Telehealth: Payer: Self-pay

## 2023-04-19 NOTE — Telephone Encounter (Signed)
 Patient has an MRI scheduled and needs something to help him stay still during it.

## 2023-04-19 NOTE — Telephone Encounter (Signed)
 Patient called requesting to have medication to help him be calm for his MRI that is scheduled for Saturday 04/22/2023 at 9:00 am. Please advise patient.

## 2023-04-20 ENCOUNTER — Other Ambulatory Visit: Payer: Self-pay | Admitting: Urology

## 2023-04-20 MED ORDER — DIAZEPAM 5 MG PO TABS: ORAL_TABLET | ORAL | 0 refills | Status: DC

## 2023-04-20 NOTE — Telephone Encounter (Signed)
 Pt states he does not have this medication. Please advise.

## 2023-04-21 NOTE — Telephone Encounter (Signed)
 2nd attempt to reach pt via phone call. LVM for pt to return call.

## 2023-04-21 NOTE — Telephone Encounter (Signed)
 LVM for pt to return call

## 2023-04-21 NOTE — Telephone Encounter (Signed)
 Script sent in yesterday by Michiel Cowboy.  Vanna Scotland, MD

## 2023-04-22 ENCOUNTER — Ambulatory Visit
Admission: RE | Admit: 2023-04-22 | Discharge: 2023-04-22 | Disposition: A | Discharge status: Home / Self Care | Payer: Medicare HMO | Source: Ambulatory Visit | Attending: Urology | Admitting: Urology

## 2023-04-22 DIAGNOSIS — R1900 Intra-abdominal and pelvic swelling, mass and lump, unspecified site: Secondary | ICD-10-CM | POA: Insufficient documentation

## 2023-04-22 MED ORDER — GADOBUTROL 1 MMOL/ML IV SOLN
7.0000 mL | Freq: Once | INTRAVENOUS | Status: AC | PRN
  Administered 2023-04-22: 7 mL via INTRAVENOUS

## 2023-05-26 ENCOUNTER — Other Ambulatory Visit: Payer: Medicare HMO

## 2023-05-26 DIAGNOSIS — Z87898 Personal history of other specified conditions: Secondary | ICD-10-CM

## 2023-05-26 DIAGNOSIS — N138 Other obstructive and reflux uropathy: Secondary | ICD-10-CM

## 2023-05-27 LAB — PSA: Prostate Specific Ag, Serum: 3.2 ng/mL (ref 0.0–4.0)

## 2023-05-31 ENCOUNTER — Ambulatory Visit: Payer: Self-pay | Admitting: Urology

## 2023-05-31 VITALS — BP 124/57 | HR 69

## 2023-05-31 DIAGNOSIS — N401 Enlarged prostate with lower urinary tract symptoms: Secondary | ICD-10-CM | POA: Diagnosis not present

## 2023-05-31 DIAGNOSIS — Z87898 Personal history of other specified conditions: Secondary | ICD-10-CM

## 2023-05-31 DIAGNOSIS — N138 Other obstructive and reflux uropathy: Secondary | ICD-10-CM

## 2023-05-31 DIAGNOSIS — R1900 Intra-abdominal and pelvic swelling, mass and lump, unspecified site: Secondary | ICD-10-CM | POA: Diagnosis not present

## 2023-05-31 DIAGNOSIS — G20A1 Parkinson's disease without dyskinesia, without mention of fluctuations: Secondary | ICD-10-CM

## 2023-05-31 LAB — BLADDER SCAN AMB NON-IMAGING: Scan Result: 28

## 2023-05-31 NOTE — Progress Notes (Signed)
 Elfrieda Grise Plume,acting as a scribe for Christian Gimenez, MD.,have documented all relevant documentation on the behalf of Christian Gimenez, MD,as directed by  Christian Gimenez, MD while in the presence of Christian Gimenez, MD.  05/31/23 12:03 PM   Alonna Art 05-29-1950 [id number]  Referring provider: Efraim Grange, NP [address] [place],  [place] [address]  Chief Complaint  Patient presents with   Follow-up    HPI: 73 year old male who presents today for annual follow up. He was last seen a year ago. He has a personal history of nephrolithiasis, elevated PSA, BPH with LUTS, bladder stones, and a pelvic lipoma.   In terms of his elevated PSA, he had a biopsy in 2014 which was suspicious at the right lateral base. His PSA trend is as below.  His PSA has remained stable at 3.2, and a rectal exam was deferred due to this stability.   He is not currently on any medications for BPH.   He underwent a pelvic MRI, which showed a mass that the radiologist noted as more conspicuous, possibly indicating a simple lipoma versus a well-differentiated liposarcoma. I personally reviewed his MRI today and compared it to his previous, which was over 2 years ago and there is minimal growth overall.   He reports no new urinary symptoms and is managing well despite his Parkinson's disease, which affects his daily activities.  PSA trend:  07/24/2012         2.1 08/2012 prostate biopsy - small focus of glands suspicious for prostate cancer, right lateral base 07/24/2013         2.38 01/20/14         2.28 5/17                2.9 05/2016            2.58  --> started finasteride shortly thereafter 12/19              2.1   1/21                3.0  No longer on finasteride 6/21                2.2 07/2020            3.67 01/2021          3.51 04//2024         3.5 05/26/2023     3.2  Results for orders placed or performed in visit on 05/31/23  BLADDER SCAN AMB NON-IMAGING  Result Value Ref Range    Scan Result 28 ml     IPSS     Row Name 05/31/23 0800         International Prostate Symptom Score   How often have you had the sensation of not emptying your bladder? Less than half the time     How often have you had to urinate less than every two hours? Not at All     How often have you found you stopped and started again several times when you urinated? Less than 1 in 5 times     How often have you found it difficult to postpone urination? Less than 1 in 5 times     How often have you had a weak urinary stream? Less than 1 in 5 times     How often have you had to strain to start urination? Less than 1 in 5 times  How many times did you typically get up at night to urinate? None     Total IPSS Score 6       Quality of Life due to urinary symptoms   If you were to spend the rest of your life with your urinary condition just the way it is now how would you feel about that? Pleased              Score:  1-7 Mild 8-19 Moderate 20-35 Severe    PMH: Past Medical History:  Diagnosis Date   Acid reflux 03/07/2011   Anxiety    Arteriosclerosis of coronary artery 03/07/2011   Overview:  Non ST-elevation myocardial infarction.  Status post coronary artery bypass grafting x4 with left internal mammary artery to left anterior descending artery, saphenous vein graft to posterior descending artery, obtuse marginal 2 and obtuse marginal 1 individually on 02/10/2011, by [facility].    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     GERD (gastroesophageal reflux disease)    History of hiatal hernia    History of kidney stones    HLD (hyperlipidemia) 04/29/2011   Overview:  Formatting of this note may be  different from the original. Component     Latest Ref Rng 02/10/2011 05/02/2011  Cholesterol, Total      171 114  Triglyceride      64 111  HDL Cholesterol      46 50  Low Density Lipoprotein      105 42     Hyperlipidemia    Hypertension    IGT (impaired glucose tolerance)    Lipids serum increased    Low vitamin D level    Myocardial infarction (HCC)    Parkinson's disease (HCC) 03/07/2011   Pelvic mass    Peripheral edema    Renal disorder    Restless leg 03/21/2012   RLS (restless legs syndrome)    Situational depression     Surgical History: Past Surgical History:  Procedure Laterality Date   CARDIAC CATHETERIZATION     COLONOSCOPY WITH PROPOFOL N/A 09/29/2014   Procedure: COLONOSCOPY WITH PROPOFOL;  Surgeon: Deveron Fly, MD;  Location: [facility] ENDOSCOPY;  Service: Endoscopy;  Laterality: N/A;   COLONOSCOPY WITH PROPOFOL N/A 12/25/2017   Procedure: COLONOSCOPY WITH PROPOFOL;  Surgeon: Deveron Fly, MD;  Location[facility] ENDOSCOPY;  Service: Endoscopy;  Laterality: N/A;   COLONOSCOPY WITH PROPOFOL N/A 04/11/2023   Procedure: COLONOSCOPY WITH PROPOFOL;  Surgeon: Shane Darling, MD;  Location: [facility] ENDOSCOPY;  Service: Endoscopy;  Laterality: N/A;   CORONARY ARTERY BYPASS GRAFT     ESOPHAGOGASTRODUODENOSCOPY (EGD) WITH PROPOFOL N/A 09/29/2014   Procedure: ESOPHAGOGASTRODUODENOSCOPY (EGD) WITH PROPOFOL;  Surgeon: Deveron Fly, MD;  Location: [facility] ENDOSCOPY;  Service: Endoscopy;  Laterality: N/A;   ESOPHAGOGASTRODUODENOSCOPY (EGD) WITH PROPOFOL N/A 06/26/2015   Procedure: ESOPHAGOGASTRODUODENOSCOPY (EGD) WITH PROPOFOL;  Surgeon: Deveron Fly, MD;  Location: [facility] ENDOSCOPY;  Service: Endoscopy;  Laterality: N/A;   HAND SURGERY     hemmorrhoidectomy     HERNIA REPAIR     LITHOTRIPSY     NEUROPLASTY / TRANSPOSITION MEDIAN NERVE AT CARPAL TUNNEL Left     Home  Medications:  Allergies as of 05/31/2023       Reactions   Penicillins    Tramadol         Medication  List        Accurate as of May 31, 2023 12:03 PM. If you have any questions, ask your nurse or doctor.          amitriptyline 10 MG tablet Commonly known as: ELAVIL Take 10 mg by mouth at bedtime.   ascorbic acid 500 MG tablet Commonly known as: VITAMIN C Take 500 mg by mouth daily.   atorvastatin 80 MG tablet Commonly known as: LIPITOR Take 80 mg by mouth daily.   benzonatate 100 MG capsule Commonly known as: TESSALON Take by mouth.   carbidopa-levodopa 25-100 MG tablet Commonly known as: SINEMET IR Take 1 tablet by mouth 3 (three) times daily.   Cholecalciferol 50 MCG (2000 UT) Caps Take by mouth.   citalopram 10 MG tablet Commonly known as: CELEXA Take 10 mg by mouth daily.   cyanocobalamin 1000 MCG tablet Take 100 mcg by mouth daily.   diazepam 5 MG tablet Commonly known as: Valium Take 1 tablet 30 minutes prior to MRI   GaviLyte-N with Flavor Pack 420 g solution Generic drug: polyethylene glycol-electrolytes TAKE 4,000 MLS BY MOUTH AS DIRECTED   glucosamine-chondroitin 500-400 MG tablet Take 1 tablet by mouth 3 (three) times daily.   hyoscyamine 0.375 MG 12 hr tablet Commonly known as: LEVBID Take by mouth.   lisinopril 5 MG tablet Commonly known as: ZESTRIL Take 5 mg by mouth daily.   metFORMIN 500 MG 24 hr tablet Commonly known as: GLUCOPHAGE-XR Take 500 mg by mouth daily with breakfast.   metoprolol succinate 25 MG 24 hr tablet Commonly known as: TOPROL-XL Take 25 mg by mouth daily.   multivitamin tablet Take 1 tablet by mouth daily.   mupirocin ointment 2 % Commonly known as: BACTROBAN Apply two times a day for 7 days.   nitroGLYCERIN 0.4 MG SL tablet Commonly known as: NITROSTAT Place 0.4 mg under the tongue every 5 (five) minutes as needed for chest pain.   omega-3 acid ethyl esters 1 g capsule Commonly known as: LOVAZA Take by mouth 2 (two) times daily.   pantoprazole 40 MG tablet Commonly known as: PROTONIX Take 40 mg  by mouth daily.   pioglitazone 30 MG tablet Commonly known as: ACTOS Take 30 mg by mouth daily.   rOPINIRole 0.5 MG tablet Commonly known as: REQUIP Take 0.5 mg by mouth 3 (three) times daily.   torsemide 10 MG tablet Commonly known as: DEMADEX Take 1 tablet by mouth daily.   triamcinolone 55 MCG/ACT Aero nasal inhaler Commonly known as: NASACORT Place 2 sprays into the nose daily.        Allergies:  Allergies  Allergen Reactions   Penicillins    Tramadol     Family History: Family History  Problem Relation Age of Onset   Asthma Mother    Emphysema Mother    Alzheimer's disease Father     Social History:  reports that he has never smoked. He quit smokeless tobacco use about 16 years ago.  His smokeless tobacco use included chew. He reports that he does not drink alcohol and does not use drugs.   Physical Exam: BP (!) 124/57   Pulse 69   Constitutional:  Alert and oriented, No acute distress. HEENT: [place] AT, moist mucus membranes.  Trachea midline, no masses. Rectal: Deferred based on PSA stability Neurologic:  Grossly intact, no focal deficits, moving all 4 extremities. Psychiatric: Normal mood and affect.  Pertinent Imaging: EXAM: MRI PELVIS WITHOUT AND WITH CONTRAST   TECHNIQUE: Multiplanar multisequence MR imaging of the pelvis was performed both before and after administration of intravenous contrast.   CONTRAST:  7mL GADAVIST GADOBUTROL 1 MMOL/ML IV SOLN   COMPARISON:  MRI pelvis 04/30/2021, 03/20/2020, 02/12/2019, 01/30/2018   FINDINGS: Bones:   No hip fracture, dislocation or avascular necrosis.   No periosteal reaction or bone destruction. No aggressive osseous lesion.   Normal sacrum and sacroiliac joints. No SI joint widening or erosive changes.   L4-5: Minimal grade 1 anterolisthesis of L4 on L5. Broad-based disc bulge with a small stone near annular fissure. Moderate bilateral facet arthropathy. Moderate central canal stenosis. Mild  bilateral foraminal stenosis.   L5-S1: Minimal grade 1 anterolisthesis of L5 on S1. Broad-based disc bulge. 2 mild bilateral facet arthropathy. Mild left foraminal stenosis.   Articular cartilage and labrum   Articular cartilage: Mild partial-thickness cartilage loss of the femoral head and acetabulum bilaterally.   Labrum:  Nondisplaced right anterosuperior labral tear.   Joint or bursal effusion   Joint effusion:  No hip joint effusion.  No SI joint effusion.   Bursae:  No bursal fluid.   Muscles and tendons   Flexors: Normal.   Extensors: Normal.   Abductors: Normal.   Adductors: Persistent lipomatous mass in the right side of the pelvis involving the right obturator internus and externus muscles extending to the obturator foramen. Lipomatous mass within the obturator externus muscle measures 10 x 4.1 x 5.8 cm. Lipomatous mass within the left obturator internus muscle within the pelvis measures 7.4 x 6 x 6.8 cm with mass effect on the prostate and rectum displacing it towards the left. Enhancing soft tissue component/septations as the mass courses through the obturator foramen which more conspicuous compared to the prior examinations.   Gluteals: Normal.   Hamstrings: Normal.   Other findings   No pelvic free fluid. No fluid collection or hematoma. No inguinal lymphadenopathy. No inguinal hernia.   IMPRESSION: 1. Persistent lipomatous mass in the right side of the pelvis involving the right obturator internus and externus muscles extending to the obturator foramen with enhancing soft tissue component/septations as the mass courses through the obturator foramen more conspicuous compared with the prior examinations which may reflect a traumatized simple lipoma versus a well-differentiated liposarcoma. 2. No hip fracture, dislocation or avascular necrosis. 3. L4-5: Minimal grade 1 anterolisthesis of L4 on L5. Broad-based disc bulge with a small stone near  annular fissure. Moderate bilateral facet arthropathy. Moderate central canal stenosis. Mild bilateral foraminal stenosis.     Electronically Signed   By: Onnie Bilis M.D.   On: 05/01/2023 13:44  This was personally reviewed and I agree with the radiologic interpretation.  Assessment & Plan:    1. Elevated PSA - PSA remains stable at 3.2.  - No rectal exam performed today due to PSA stability.  - Continue monitoring PSA levels annually.  2. BPH with LUTS - He reports stable urinary symptoms.  - No current medication for BPH.  - Continue monitoring symptoms and consider medication if symptoms worsen.  3. Pelvic Lipoma - Recent MRI shows the mass is more conspicuous but unchanged in size.  - Continue conservative management.  - Plan for follow-up MRI in one year to monitor for changes.  4. Parkinson's Disease - He reports stable symptoms with current medication regimen.  - Continue current management  and follow up with neurologist as needed.  Return in about 1 year (around 05/30/2024) for repeat PSA and MRI.  I have reviewed the above documentation for accuracy and completeness, and I agree with the above.   Christian Gimenez, MD   [facility] [address] [place], [place] [address] [phone]

## 2023-07-12 ENCOUNTER — Other Ambulatory Visit: Payer: Self-pay | Admitting: Gerontology

## 2023-07-12 DIAGNOSIS — M279 Disease of jaws, unspecified: Secondary | ICD-10-CM

## 2023-07-13 ENCOUNTER — Encounter: Payer: Self-pay | Admitting: Gerontology

## 2023-07-15 ENCOUNTER — Encounter: Payer: Self-pay | Admitting: Emergency Medicine

## 2023-07-15 ENCOUNTER — Ambulatory Visit (INDEPENDENT_AMBULATORY_CARE_PROVIDER_SITE_OTHER)

## 2023-07-15 ENCOUNTER — Ambulatory Visit
Admission: EM | Admit: 2023-07-15 | Discharge: 2023-07-15 | Disposition: A | Discharge status: Home / Self Care | Attending: Emergency Medicine | Admitting: Emergency Medicine

## 2023-07-15 DIAGNOSIS — S46912A Strain of unspecified muscle, fascia and tendon at shoulder and upper arm level, left arm, initial encounter: Secondary | ICD-10-CM | POA: Diagnosis not present

## 2023-07-15 DIAGNOSIS — M25512 Pain in left shoulder: Secondary | ICD-10-CM

## 2023-07-15 MED ORDER — DICLOFENAC SODIUM 1 % EX GEL: 2.0000 g | Freq: Four times a day (QID) | CUTANEOUS | 0 refills | Status: DC

## 2023-07-15 MED ORDER — BACLOFEN 5 MG PO TABS: 5.0000 mg | ORAL_TABLET | Freq: Three times a day (TID) | ORAL | 0 refills | Status: AC | PRN

## 2023-07-15 NOTE — ED Triage Notes (Signed)
 Patient states that he fell Wed and landed on the concrete. Patient unsure if he hit his head.  Patient states that his left shoulder started hurting last night.

## 2023-07-15 NOTE — Discharge Instructions (Addendum)
 The x-rays of your shoulder do not show any evidence of broken or dislocated bones.  I do believe you have strained your shoulder as a result of your recent fall.  Apply the topical diclofenac gel, 2 g every 6 hours, as needed for pain and inflammation to your left shoulder joint.  I am also going to prescribe you a low-dose muscle relaxer called baclofen.  You may take 1 tablet every 8 hours as needed for muscle pain and tension.  Follow the shoulder exercises given in your discharge instructions.  If your symptoms do not improve, or they worsen, either return for reevaluation or follow-up with orthopedics such as [facility] in [place] or here in [place].

## 2023-07-15 NOTE — ED Provider Notes (Signed)
 [facility]    CSN: [id number] Arrival date & time: 07/15/23  0802      History   Chief Complaint Chief Complaint  Patient presents with   Fall   Shoulder Pain    left    HPI Christian Thompson is a 73 y.o. male.   HPI  73 year old male with past medical history significant for Parkinson's disease, RLS, hyperlipidemia, GERD, focal dystonia, hypertension, CAD, BPH, and carpal tunnel syndrome presents for evaluation of pain in the left shoulder.  He reports that he was running to his car in a parking lot 3 days ago and fell on the concrete.  He has an abrasion on the left shoulder from the fall.  He reports that he did not have any pain at the time and for the subsequent 2 days she did yard work to include mowing the Therapist, music.  He developed pain in his shoulder last night.  Past Medical History:  Diagnosis Date   Acid reflux 03/07/2011   Anxiety    Arteriosclerosis of coronary artery 03/07/2011   Overview:  Non ST-elevation myocardial infarction.  Status post coronary artery bypass grafting x4 with left internal mammary artery to left anterior descending artery, saphenous vein graft to posterior descending artery, obtuse marginal 2 and obtuse marginal 1 individually on 02/10/2011, by [facility].    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     GERD (gastroesophageal reflux disease)    History of hiatal hernia    History of kidney stones    HLD (hyperlipidemia) 04/29/2011   Overview:  Formatting of this note may be different from the original. Component     Latest Ref Rng 02/10/2011 05/02/2011  Cholesterol, Total      171 114  Triglyceride      64 111  HDL Cholesterol      46 50  Low Density  Lipoprotein      105 42     Hyperlipidemia    Hypertension    IGT (impaired glucose tolerance)    Lipids serum increased    Low vitamin D level    Myocardial infarction (HCC)    Parkinson's disease (HCC) 03/07/2011   Pelvic mass    Peripheral edema    Renal disorder    Restless leg 03/21/2012   RLS (restless legs syndrome)    Situational depression     Patient Active Problem List   Diagnosis Date Noted   History of elevated PSA 06/28/2015   BPH with obstruction/lower urinary tract symptoms 06/28/2015   Nephrolithiasis 06/28/2015   Carpal tunnel syndrome 03/26/2014   Focal dystonia 03/12/2014   Pain of left hand 03/12/2014   Restless leg 03/21/2012   HLD (hyperlipidemia) 04/29/2011   Arteriosclerosis of coronary artery 03/07/2011   Essential (primary) hypertension 03/07/2011   Acid reflux 03/07/2011   History of surgical procedure 03/07/2011   Parkinson's disease (HCC) 03/07/2011    Past Surgical History:  Procedure Laterality Date   CARDIAC CATHETERIZATION     COLONOSCOPY WITH PROPOFOL  N/A 09/29/2014   Procedure: COLONOSCOPY WITH PROPOFOL ;  Surgeon: Deveron Fly, MD;  Location: [facility] ENDOSCOPY;  Service: Endoscopy;  Laterality: N/A;   COLONOSCOPY WITH  PROPOFOL  N/A 12/25/2017   Procedure: COLONOSCOPY WITH PROPOFOL ;  Surgeon: Deveron Fly, MD;  Location: [facility] ENDOSCOPY;  Service: Endoscopy;  Laterality: N/A;   COLONOSCOPY WITH PROPOFOL  N/A 04/11/2023   Procedure: COLONOSCOPY WITH PROPOFOL ;  Surgeon: Shane Darling, MD;  Location: [facility] ENDOSCOPY;  Service: Endoscopy;  Laterality: N/A;   CORONARY ARTERY BYPASS GRAFT     ESOPHAGOGASTRODUODENOSCOPY (EGD) WITH PROPOFOL  N/A 09/29/2014   Procedure: ESOPHAGOGASTRODUODENOSCOPY (EGD) WITH PROPOFOL ;  Surgeon: Deveron Fly, MD;  Location: [facility] ENDOSCOPY;  Service: Endoscopy;  Laterality: N/A;   ESOPHAGOGASTRODUODENOSCOPY (EGD) WITH PROPOFOL  N/A 06/26/2015   Procedure: ESOPHAGOGASTRODUODENOSCOPY (EGD) WITH PROPOFOL ;   Surgeon: Deveron Fly, MD;  Location: [facility] ENDOSCOPY;  Service: Endoscopy;  Laterality: N/A;   HAND SURGERY     hemmorrhoidectomy     HERNIA REPAIR     LITHOTRIPSY     NEUROPLASTY / TRANSPOSITION MEDIAN NERVE AT CARPAL TUNNEL Left        Home Medications    Prior to Admission medications   Medication Sig Start Date End Date Taking? Authorizing Provider  Baclofen 5 MG TABS Take 1 tablet (5 mg total) by mouth 3 (three) times daily as needed. 07/15/23  Yes Kent Pear, NP  diclofenac Sodium (VOLTAREN) 1 % GEL Apply 2 g topically 4 (four) times daily. 07/15/23  Yes Kent Pear, NP  amitriptyline (ELAVIL) 10 MG tablet Take 10 mg by mouth at bedtime.    [provider]  atorvastatin (LIPITOR) 80 MG tablet Take 80 mg by mouth daily.    [provider]  benzonatate (TESSALON) 100 MG capsule Take by mouth. 01/24/18   [provider]  carbidopa-levodopa (SINEMET IR) 25-100 MG per tablet Take 1 tablet by mouth 3 (three) times daily.    [provider]  Cholecalciferol 50 MCG (2000 UT) CAPS Take by mouth. 08/13/20   [provider]  citalopram (CELEXA) 10 MG tablet Take 10 mg by mouth daily.    [provider]  cyanocobalamin 1000 MCG tablet Take 100 mcg by mouth daily.    [provider]  diazepam  (VALIUM ) 5 MG tablet Take 1 tablet 30 minutes prior to MRI 04/20/23   McGowan, Cathleen Coach A, PA-C  GAVILYTE-N WITH FLAVOR PACK 420 g solution TAKE 4,000 MLS BY MOUTH AS DIRECTED 12/21/17   [provider]  glucosamine-chondroitin 500-400 MG tablet Take 1 tablet by mouth 3 (three) times daily.    [provider]  hyoscyamine (LEVBID) 0.375 MG 12 hr tablet Take by mouth. 12/21/17 04/08/20  [provider]  lisinopril (PRINIVIL,ZESTRIL) 5 MG tablet Take 5 mg by mouth daily.    [provider]  metFORMIN (GLUCOPHAGE-XR) 500 MG 24 hr tablet Take 500 mg by mouth daily with breakfast.    [provider]   metoprolol succinate (TOPROL-XL) 25 MG 24 hr tablet Take 25 mg by mouth daily.    [provider]  Multiple Vitamin (MULTIVITAMIN) tablet Take 1 tablet by mouth daily.    [provider]  mupirocin  ointment (BACTROBAN ) 2 % Apply two times a day for 7 days. 10/24/16   Janett Medin, NP  nitroGLYCERIN (NITROSTAT) 0.4 MG SL tablet Place 0.4 mg under the tongue every 5 (five) minutes as needed for chest pain.    [provider]  omega-3 acid ethyl esters (LOVAZA) 1 G capsule Take by mouth 2 (two) times daily.    [provider]  pantoprazole (PROTONIX) 40 MG tablet Take 40 mg by mouth daily.  [provider]  pioglitazone (ACTOS) 30 MG tablet Take 30 mg by mouth daily.    [provider]  rOPINIRole (REQUIP) 0.5 MG tablet Take 0.5 mg by mouth 3 (three) times daily.    [provider]  torsemide (DEMADEX) 10 MG tablet Take 1 tablet by mouth daily. 03/03/23 03/02/24  [provider]  triamcinolone (NASACORT) 55 MCG/ACT AERO nasal inhaler Place 2 sprays into the nose daily.    [provider]  vitamin C (ASCORBIC ACID) 500 MG tablet Take 500 mg by mouth daily.    [provider]    Family History Family History  Problem Relation Age of Onset   Asthma Mother    Emphysema Mother    Alzheimer's disease Father     Social History Social History   Tobacco Use   Smoking status: Never   Smokeless tobacco: Former    Types: Chew    Quit date: 02/26/2007  Vaping Use   Vaping status: Never Used  Substance Use Topics   Alcohol use: No   Drug use: No     Allergies   Penicillins and Tramadol   Review of Systems Review of Systems  Musculoskeletal:  Positive for arthralgias. Negative for joint swelling.  Skin:  Positive for wound. Negative for color change.     Physical Exam Triage Vital Signs ED Triage Vitals  Encounter Vitals Group     BP      Systolic BP Percentile      Diastolic BP Percentile       Pulse      Resp      Temp      Temp src      SpO2      Weight      Height      Head Circumference      Peak Flow      Pain Score      Pain Loc      Pain Education      Exclude from Growth Chart    No data found.  Updated Vital Signs BP 132/82 (BP Location: Right Arm)   Pulse 67   Temp 98.8 F (37.1 C) (Oral)   Resp 15   Ht 5\' 5"  (1.651 m)   Wt 162 lb 0.6 oz (73.5 kg)   SpO2 95%   BMI 26.96 kg/m   Visual Acuity Right Eye Distance:   Left Eye Distance:   Bilateral Distance:    Right Eye Near:   Left Eye Near:    Bilateral Near:     Physical Exam Vitals and nursing note reviewed.  Constitutional:      Appearance: Normal appearance. He is not ill-appearing.  HENT:     Head: Normocephalic and atraumatic.  Musculoskeletal:        General: Tenderness and signs of injury present. No swelling. Normal range of motion.  Skin:    General: Skin is warm and dry.     Capillary Refill: Capillary refill takes less than 2 seconds.     Findings: No bruising or erythema.  Neurological:     General: No focal deficit present.     Mental Status: He is alert and oriented to person, place, and time.      UC Treatments / Results  Labs (all labs ordered are listed, but only abnormal results are displayed) Labs Reviewed - No data to display  EKG   Radiology DG Shoulder Left Result Date: 07/15/2023 CLINICAL DATA:  Pain in left  shoulder girdle status post fall 3 days ago. EXAM: LEFT SHOULDER - 2+ VIEW COMPARISON:  None Available. FINDINGS: There is no evidence of fracture or dislocation. There is no evidence of arthropathy or other focal bone abnormality. Previous median sternotomy and CABG. Soft tissues are unremarkable. IMPRESSION: Negative. Electronically Signed   By: Nicoletta Barrier M.D.   On: 07/15/2023 09:52    Procedures Procedures (including critical care time)  Medications Ordered in UC Medications - No data to display  Initial Impression / Assessment and Plan /  UC Course  I have reviewed the triage vital signs and the nursing notes.  Pertinent labs & imaging results that were available during my care of the patient were reviewed by me and considered in my medical decision making (see chart for details).   Patient is a nontoxic-appearing 73 year old male presenting for evaluation of left shoulder pain status post fall as outlined HPI above.  As you can see the above, the patient has an abrasion to his left shoulder over his acromion process.  He has mild tenderness at the site though it is unclear if it is secondary to the abrasion or underlying bony abnormality.  No crepitus appreciated.  He denies pain with palpation of the remainder of his clavicle or with palpation at the glenohumeral joint and scapular spine.  He does have significant tension in the upper trapezius on the left.  No pain with palpation of the humerus, elbow, forearm, wrist, hand, or fingers.  He does have a small abrasion on his forehead but he did not get knocked out.  He is answering all questions appropriately.  I will obtain a radiograph of the left shoulder to rule out any bony abnormality.  Left shoulder radiographs independently reviewed and evaluated by me.  Impression: In the Y view there is a questionable irregularity to the distal clavicle that does not appear in any other view.  No other evidence of fracture or dislocation.  Radiology overread is pending. Radiology impression states no evidence of fracture or dislocation.  I will discharge patient home with a diagnosis of left shoulder contusion and have him apply Voltaren gel to the shoulder, 2 g every 6 hours, as needed for pain and inflammation.  I also prescribed 5 mg of baclofen that he can take every 8 hours to help with the muscle tension.     Final Clinical Impressions(s) / UC Diagnoses   Final diagnoses:  Acute pain of left shoulder  Left shoulder strain, initial encounter     Discharge Instructions       The x-rays of your shoulder do not show any evidence of broken or dislocated bones.  I do believe you have strained your shoulder as a result of your recent fall.  Apply the topical diclofenac gel, 2 g every 6 hours, as needed for pain and inflammation to your left shoulder joint.  I am also going to prescribe you a low-dose muscle relaxer called baclofen.  You may take 1 tablet every 8 hours as needed for muscle pain and tension.  Follow the shoulder exercises given in your discharge instructions.  If your symptoms do not improve, or they worsen, either return for reevaluation or follow-up with orthopedics such as [facility] in [place] or here in [place].   ED Prescriptions     Medication Sig Dispense Auth. Provider   diclofenac Sodium (VOLTAREN) 1 % GEL Apply 2 g topically 4 (four) times daily. 100 g Kent Pear, NP   Baclofen 5 MG  TABS Take 1 tablet (5 mg total) by mouth 3 (three) times daily as needed. 30 tablet Kent Pear, NP      PDMP not reviewed this encounter.   Kent Pear, NP 07/15/23 [phone]

## 2023-07-31 ENCOUNTER — Inpatient Hospital Stay: Admission: RE | Admit: 2023-07-31 | Source: Ambulatory Visit

## 2023-09-01 NOTE — Discharge Summary (Signed)
 ------------------------------------------------------------------------------- Attestation signed by Christian Christian PARAS, MD PhD at 09/01/23 2116 I saw and evaluated the patient, participating in the key portions of the service. I reviewed the resident's note. I agree with the resident's findings and plan. I spent greater than 45 minutes in the care of the patient on the day of discharge, including history and exam, review of records, coordination of care, counseling of the patient. - Christian Thompson SHARLEE, MD PhD   Much better this morning. Alert, conversant, appears at or very near to neurologic baseline. Rapid improvement suggests that inciting factor was medication-related. He and his wife were able to provide some additional context and history.   Appears it was probably baclofen  that led to his acute encephalopathy. Possible that AKI impaired renal clearance too. AKI, in turn, may relate to acute (and recurrent) urinary retention. He has urology follow-up soon. He is ok with recommendation to stop baclofen . Having some more assistance with medication management was discussed with him as well.  -------------------------------------------------------------------------------   Physician Discharge Summary HBR 2 BT1 HBR [address] [facility] [place] [address] Dept: [phone] Loc: [phone]   Identifying Information:  Christian Thompson 12-20-1950 [id number]  Primary Care Physician: Christian Thompson, AGNP   Code Status: Full Code  Admit Date: 08/31/2023  Discharge Date: 09/01/2023   Discharge To: Home with Home Health and/or PT/OT  Discharge Service: [facility] - General Medicine Floor Team (MED [facility]RISTELL[facility] GLEN[facility] [place])   Discharge Attending Physician: Christian Thompson Sharlee, MD PhD  Discharge Diagnoses:  Principal Problem (Resolved):   Acute encephalopathy (POA: Yes) Active Problems:   Thompson disease    (POA: Yes)   Type 2 diabetes mellitus without complication, without long-term  current use of insulin    (POA: Yes)   Urinary retention (POA: Yes)   AKI (acute kidney injury) (POA: Yes)   Urinary tract infection (POA: Yes)   BPH (benign prostatic hyperplasia) (POA: Yes)   Hospital Course:  Outpatient Follow Up  [ ]  Primary Care Provider  - Discontinue baclofen   - Continue gabapentin 100mg  nightly  - Discuss mirtazapine given concerns for polypharmacy  [ ]  Urology  - Workup for urinary retention and foley management --hopeful baclofen  discontinuation will help with urinary retention  [ ]  Neurology  - Referral for management of Thompson's --referred to Christian Thompson at [place], but this is somewhat far for patient and family, would consider other neurologists closer to home if possible as patient is looking to switch where he gets his neurology care.  Active Problems   Acute Encephalopathy  Somnolence  Medication toxicity  Christian Thompson presented to the ED with significant somnolence and altered mental status that we suspect is secondary to polypharmacy or taking too much of prescribed neuroactive medication in an altered state. At presentation he was difficult to rouse but with normal vital signs, however we are reassured that his presentation has improved significantly as he was awake and conversational at 1730 on 7/10 and remained oriented x 3. We suspect that his acute episode of somnolence and AMS is secondary to home medication toxicity with agents like baclofen /mirtazapine/gabapentin.  The combination of his symptoms, including intermittent apnea x 2, bradycardia, alternating hyper and hypotension, and extreme somnolence are consistent with toxicity from a GABAergic medication.  It is also possible that the patient's AKI may have contributed to increased accumulation of medications. He may also have AMS secondary to urinary retention as the patient had more than of retention prior to cath placement on 7/10, which may  have led to the confusion, resulting in potential  misuse of prescription medications unintentionally.  Electrolytes were normal, there was no leukocytosis, no lactate elevation on presentation, U tox was negative, CK normal, troponin normal; all reassuring against life-threatening causes of altered mental status.  We had a lengthy family discussion to make recommendations around stopping Baclofen , continuing Gabapentin, and utilizing a bubble pack (pharmacy 30 day pillbox) to decrease risk of future events.  Patient is also being discharged with home health, including home nursing, to help with ensuring medications are stored safely and pillboxes are used properly. 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 Family brought up concerns around management of Thompson's medication and requested support finding another provider.  - Referral to Christian Thompson with Neurology  - Home medications              - Carbidopa-Levodopa 2 tablets 6 times a day              - Requip - 1mg  5 times a day    Urinary Retention  AKI (resolved)  recent UTI  BPH  Pelvic Mass Liposarcoma Patient had significant urinary retention at time of admission and warrants continued foley placement given high likelihood of obstructive bladder dysfunction iso of BPH and pelvic mass liposarcoma. 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 AKI improving rapidly after foley catheter placement with a decline from 1.53 on admission to 1.12 (baseline ~1), leading us  to believe AKI was post renal in nature. No significant uremia with AKI.  Very mild leukocytosis  and no focal signs on exam, normal lactate, reassuring against sepsis or even minor infection. - Continue foley, if falls out prior to urology appointment go to ED for replacement - Urology appointment 7/14, will defer inpatient consult for now and plan to discharge with Foley when the time comes  The patient's hospital stay has been complicated by the following clinically significant conditions requiring additional evaluation and treatment or having a significant effect of this patient's care: - Age related debility POA requiring additional resources: DME, PT, or OT   Nutrition Assessment: Patient was seen by dietitian during admission who recommended trial of Ensure high protein 3 times daily PC.  No malnutrition was identified.  Outpatient Provider Follow Up Issues:  See above  Touchbase with Outpatient Provider: Warm Handoff: Not completed secondary to late Friday discharge after office hours  Procedures: None ______________________________________________________________________ Discharge Medications:    Your Medication List     STOP taking these medications    aspirin 81 MG tablet Commonly known as: ECOTRIN   baclofen  5 mg Tab tablet Commonly known as: LIORESAL    LORazepam 0.5 MG tablet Commonly known as: ATIVAN   sulfamethoxazole-trimethoprim 800-160 mg per tablet Commonly known as: BACTRIM DS       CHANGE how you take these medications    carbidopa-levodopa  CR (SINEMET CR) 25-100 mg extended release 25-100 mg per tablet Take 1-2 tablets by mouth nightly. What changed: Another medication with the same name was removed. Continue taking this medication, and follow the directions you see here.       CONTINUE taking these medications    acetaminophen  325 MG tablet Commonly known as: TYLENOL  Take 2 tablets (650 mg total) by mouth every four (4) hours as needed.   ascorbic acid (vitamin C) 500 MG tablet Commonly known as: VITAMIN C Take 1 tablet (500 mg total)  by mouth daily.   atorvastatin 80 MG tablet Commonly known as: LIPITOR Take 1 tablet (80 mg total) by mouth daily.   cholecalciferol (vitamin D3-50 mcg (2,000 unit)) 50 mcg (2,000 unit) Cap Take 1 capsule (50 mcg total) by mouth in the morning.   citalopram 20 MG tablet Commonly known as: CeleXA Take 1 tablet (20 mg total) by mouth daily. TAKE 1 TABLET (20 MG TOTAL) BY MOUTH ONCE DAILY.   cyanocobalamin (vitamin B-12) 1000 MCG tablet Take 1 tablet (1,000 mcg total) by mouth daily.   gabapentin 100 MG capsule Commonly known as: NEURONTIN Take 1 capsule (100 mg total) by mouth nightly.   lisinopril 5 MG tablet Commonly known as: PRINIVIL,ZESTRIL Take 1 tablet (5 mg total) by mouth daily.   metFORMIN 500 MG 24 hr tablet Commonly known as: GLUCOPHAGE-XR Take 2 tablets (1,000 mg total) by mouth daily before breakfast. TAKE 500 MG BY MOUTH DAILY WITH BREAKFAST.   metoPROLOL succinate 25 MG 24 hr tablet Commonly known as: Toprol-XL Take 0.5 tablets (12.5 mg total) by mouth daily.   multivitamin per tablet Commonly known as: TAB-A-VITE/THERAGRAN Take 1 tablet by mouth daily.   pantoprazole 40 MG tablet Commonly known as: Protonix Take 1 tablet (40 mg total) by mouth daily.   polyethylene glycol 17 gram packet Commonly known as: MIRALAX Take 17 g by mouth daily.   rOPINIRole 0.5 MG tablet Commonly known as: REQUIP Take 2 tablets (1 mg total) by mouth five (5) times a day.   senna 8.6 mg tablet Commonly known as: SENOKOT Take 2 tablets by mouth nightly.   tamsulosin  0.4 mg capsule Commonly known as: FLOMAX  Take 1 capsule (0.4 mg total) by mouth nightly.        Allergies: Penicillins ______________________________________________________________________ Pending Test Results:   Most Recent Labs: All lab results last 24 hours -  Recent Results (from the past 24 hours)  POCT Glucose   Collection Time: 08/31/23  9:20 PM  Result Value Ref Range   Glucose, POC  132 70 - 179 mg/dL  Comprehensive Metabolic Panel   Collection Time: 09/01/23  5:48 AM  Result Value Ref Range   Sodium 144 135 - 145 mmol/L   Potassium 3.1 (L) 3.4 - 4.8 mmol/L   Chloride 100 98 - 107 mmol/L   CO2 30.7 20.0 - 31.0 mmol/L   Anion Gap 13 5 - 14 mmol/L   BUN 22 9 - 23 mg/dL   Creatinine 8.87 9.26 - 1.18 mg/dL   BUN/Creatinine Ratio 20    eGFR CKD-EPI (2021) Male 69 >=60 mL/min/1.65m2   Glucose 272 (H) 70 - 179 mg/dL   Calcium 9.1 8.7 - 89.5 mg/dL   Albumin 3.2 (L) 3.4 - 5.0 g/dL   Total Protein 6.1 5.7 - 8.2 g/dL   Total Bilirubin 1.4 (H) 0.3 - 1.2 mg/dL   AST 18 <=65 U/L   ALT 34 10 - 49 U/L   Alkaline Phosphatase 70 46 -  116 U/L  Lipid Panel   Collection Time: 09/01/23  5:48 AM  Result Value Ref Range   Cholesterol, Total 128 <200 mg/dL   Cholesterol, HDL 53 >59 mg/dL   Cholesterol, LDL, Calculated 62 <100 mg/dL   Cholesterol, Non-HDL, Calculated 75 <130 mg/dL   Triglycerides 88 <849 mg/dL   Fasting Unknown   Blood Gas Critical Care Panel, Venous   Collection Time: 09/01/23  5:48 AM  Result Value Ref Range   Specimen Source Venous    FIO2 Venous Not Specified    pH, Venous 7.43 7.32 - 7.43   pCO2, Ven 48 40 - 60 mm Hg   pO2, Ven 36 35 - 40 mm Hg   HCO3, Ven 29 (H) 22 - 27 mmol/L   Base Excess, Ven 7.4 (H) -2.0 - 2.0   O2 Saturation, Venous 62.8 40.0 - 85.0 %   Sodium Whole Blood 139 135 - 145 mmol/L   Potassium, Bld 3.1 (L) 3.4 - 4.6 mmol/L   Calcium, Ionized Venous 4.64 4.40 - 5.40 mg/dL   Glucose Whole Blood 739 Undefined mg/dL   Lactate, Venous 2.0 (H) 0.5 - 1.8 mmol/L   Hgb, blood gas 12.70 (L) 13.50 - 17.50 g/dL  CBC w/ Differential   Collection Time: 09/01/23  5:48 AM  Result Value Ref Range   WBC 7.7 3.6 - 11.2 10*9/L   RBC 4.07 (L) 4.26 - 5.60 10*12/L   HGB 12.7 (L) 12.9 - 16.5 g/dL   HCT 63.1 (L) 60.9 - 51.9 %   MCV 90.5 77.6 - 95.7 fL   MCH 31.3 25.9 - 32.4 pg   MCHC 34.6 32.0 - 36.0 g/dL   RDW 85.0 87.7 - 84.7 %   MPV 8.3 6.8 -  10.7 fL   Platelet 194 150 - 450 10*9/L   nRBC 0 <=4 /100 WBCs   Neutrophils % 73.8 %   Lymphocytes % 16.8 %   Monocytes % 7.3 %   Eosinophils % 1.5 %   Basophils % 0.6 %   Absolute Neutrophils 5.7 1.8 - 7.8 10*9/L   Absolute Lymphocytes 1.3 1.1 - 3.6 10*9/L   Absolute Monocytes 0.6 0.3 - 0.8 10*9/L   Absolute Eosinophils 0.1 0.0 - 0.5 10*9/L   Absolute Basophils 0.0 0.0 - 0.1 10*9/L  POCT Glucose   Collection Time: 09/01/23  7:17 AM  Result Value Ref Range   Glucose, POC 225 (H) 70 - 179 mg/dL  POCT Glucose   Collection Time: 09/01/23 11:49 AM  Result Value Ref Range   Glucose, POC 129 70 - 179 mg/dL    Latest Reference Range & Units 08/31/23 15:22  Methadone Screen, Urine <300 ng/mL  Negative  Opiate Scrn, Ur <300 ng/mL  Negative  Barbiturate Screen, Ur <200 ng/mL  Negative  Amphetamine Screen, Ur <500 ng/mL  Negative  Cocaine(Metab.)Screen, Urine <150 ng/mL  Negative  Benzodiazepine Screen, Urine <200 ng/mL  Negative  Cannabinoid Scrn, Ur <20 ng/mL  Negative  Oxycodone  Screen, Ur <100 ng/mL  Negative  Buprenorphine, Urine <5 ng/mL  Negative  Fentanyl  Screen, Ur <1.0 ng/mL  Negative    Latest Reference Range & Units 08/31/23 09:17  Spec Grav, UA 1.003 - 1.030  1.008  Color, UA  Colorless  Clarity, UA  Clear  pH, UA 5.0 - 9.0  5.0  Leukocyte Esterase, UA Negative  Negative  Nitrite, UA Negative  Negative  Protein, UA Negative  Negative  Glucose, UA Negative  Negative  Ketones, UA Negative  Negative  Urobilinogen,  UA <2.0 mg/dL  <7.9 mg/dL  Bilirubin, UA Negative  Negative  Blood, UA Negative  Trace !  WBC, UA <=2 /HPF 3 (H)  RBC, UA <=3 /HPF 1  Squam Epithel, UA 0 - 5 /HPF <1  Bacteria, UA None Seen /HPF Rare !  Mucus, UA None Seen /HPF Rare !  Hyaline Casts, UA 0 - 1 /LPF 1  !: Data is abnormal (H): Data is abnormally high  Relevant Studies/Radiology: ECG 12 Lead Result Date: 08/31/2023 SINUS BRADYCARDIA RIGHT BUNDLE BRANCH BLOCK ABNORMAL ECG NO PREVIOUS  ECGS AVAILABLE Confirmed by Lennie Cough [phone]) on 08/31/2023 10:17:13 PM  CT Head Wo Contrast Result Date: 08/31/2023 EXAM: Computed tomography, head or brain without contrast material. ACCESSION: [id number] UN CLINICAL INDICATION: 73 years old Male with lethargy  COMPARISON: MRI Brain 03/21/2009 TECHNIQUE: Axial CT images of the head from skull base to vertex without contrast. FINDINGS: There is no midline shift. No mass lesion. There is no evidence of acute infarct.  The sinuses are pneumatized. Bilateral cerebral white matter hypodensity, nonspecific but most likely related to chronic microvascular ischemic changes. Mild to moderate global cerebral atrophy. There are calcifications involving the carotid siphons. No intracranial hemorrhage or skull fractures.   No acute intracranial abnormality.  ______________________________________________________________________ Discharge Instructions:  Activity Instructions     Activity as tolerated          DIAGNOSIS --  You were admitted after an episode of Delirium and Somnolence (Excess Sleepiness) on 08/31/2023. Delirium is common and could have been impacted by things like medications, infections, and various diseases. We did an evaluation with labwork, imaging, and physical exams to find the cause.   We think that you experienced an episode of confusion where you took multiple medications that led to you feeling delirious and very sleepy. We monitored you closely and think you are safe to go home at this time because your vitals, labs, and physical exam are normal.   Overall, we think you were on too many medications. Given your Thompson's disease, too many medications can be dangerous and interact with each other, and it is important that we keep you safe and keep this from happening again.   MEDICATIONS -- Please refer to your after visit summary (AVS) medication list for updates or changes related to your hospital stay. We suggest that you  use a pharmacy filled bubble box or a pharmacy filled monthly pill box to minimize risk of complications.  - Stop baclofen   - Stop bactrim (trimethoprim-sulfamethoxazole)  - Continue gabapentin 100mg  nightly   SIGNS AND SYMPTOMS TO MONITOR -- If you begin to experience Fever > 100.4, Severe dizziness , Chest pain/pressure, Slurred speech , Sudden vision changes , Weakness or numbness on one side of the body , or Fainting, confusion, or seizures  call 911 or go to the emergency department.    ACTIVITY AND DIETARY GUIDANCE --  We encourage you to listen to your body and to remain as active as you can while staying safe. We want to emphasize that you should avoid falling and utilize your assisted mobility devices like canes and walkers if you have concerns around walking safely.    FOLLOW-UP APPOINTMENTS  1) Your Primary Care Doctor is listed as Christian Thompson, AGNP. Your follow-up appointment needs to be scheduled by you.  Please see the front page of your after visit summary (AVS) for your scheduled [facility] appointments and outstanding referrals. You have been referred to the following specialties listed below. If you  miss your appointment or do not hear from them please call them at the following number:  2) Please follow up with your urologist on 7/14. This is very important. 3) We have submitted a referral to Christian Thompson an internal medicine and neurologist to help manage your Thompson's. There are other options like [facility], your primary provider can talk with you more about this.    EMERGENCY CONTACT INFORMATION -- If you have additional questions about your hospitalization please call [phone]. If needed, you can ask to discuss your symptoms or questions with the internal medicine resident physician. -- If it has been more than 7 days since your hospitalization, please reach out to your primary care physician for further questions and guidance.   If your Foley  catheter comes out again before your urology appointment on 7/14, you must come back to the emergency room to get it placed again.  It has become clear, that without this catheter and right now, you cannot pee on your own.  Your urologist needs to do a test in the office to see if you can pee on your own before this can be safely removed for good.   Please be very careful not to let it get caught on anything, do not pull on it, do not try to readjust it, as all of these things can prevent it from draining properly and ultimately cause it to come out accidentally or otherwise again.  Your bladder was very distended when you got to the hospital, and it may take some time to work properly again.  Please be patient and keep the catheter in until it is removed by a urology team.     Follow Up instructions and Outpatient Referrals    Ambulatory Referral to Internal Medicine     Reason for referral: Complex Pmhx and Thompson's   Requested follow up plan: You would evaluate and manage.   Call MD for:  difficulty breathing, headache or visual disturbances     Call MD for:  persistent nausea or vomiting     Call MD for:  severe uncontrolled pain     Call MD for:  temperature >38.5 Celsius     Discharge instructions     Discharge instructions        ______________________________________________________________________ Discharge Day Services: BP 124/84   Pulse 83   Temp 36.7 C (98 F) (Axillary)   Resp 18   Ht 165.1 cm (5' 5)   Wt 62.3 kg (137 lb 5.6 oz)   SpO2 96%   BMI 22.86 kg/m   Pt seen on the day of discharge and determined appropriate for discharge.  Condition at Discharge: good  Length of Discharge: I spent greater than 30 mins in the discharge of this patient.  Delinda Greener, MS4  I attest that I have reviewed the medical student note and that the components of the history of the present illness, the physical exam, and the assessment and plan documented were performed by me or  were performed in my presence by the student where I verified the documentation and performed (or re-performed) the exam and medical decision making.   Powell Brooks, MD Internal Medicine - Pediatrics, PGY-2 Pager: [phone] *  *Some images could not be shown.

## 2023-09-01 NOTE — Care Plan (Signed)
  Care Management Final Transition Planning Assessment    09/01/2023  Patient's Post Acute Contact Information: [phone]     Has a PCP appointment been made?: No   Has a specialist appointment been made?: No No future appointments.    Post Acute Facility needed at discharge?: No        Home Care/ Home Medical Equipment needed at discharge?: Yes Home Care/ Home Medical Equipment: Home Health (specify)  Home Health Provider (Name/Phone #): [facility]    Home Care [facility]  - Discharged on 09/01/2023 Admission date: 08/31/2023 - Discharge disposition: Home with Self Care    Service Provider Services Address Phone Fax Patient Preferred   [facility] [address] [place] [address] [phone] --       Outpatient/Community Referrals needed for discharge?: No     Agency detail (Name/Phone #): PCP [facility] Transportation Anticipated: family or friend will provide    Currently receiving outpatient dialysis?: No       Discharge Disposition: Home w/ Home Health        Quality data for continuing care services shared with patient and/or representative?: Yes Patient and/or family were provided with choice of facilities / services that are available and appropriate to meet post hospital care needs?: Yes  List choices in order highest to lowest preferred, if applicable. : no pref     Final Assessment Complete: Yes                     Readmission Risk Score:  Predictive Model Details        16% (Medium)  Factor Value   Calculated 09/01/2023 12:00 33% Number of active inpatient medication orders 41   [facility] Risk of Unplanned Readmission Model 20% Number of ED visits in last six months 3   *Archived Data 11% ECG/EKG order present in last 6 months    8% Imaging order present in last 6 months    7% Latest hemoglobin low (12.7 g/dL)    7% Age 73    6% Number of hospitalizations in last  year 1    5% Active anticoagulant inpatient medication order present    1% Charlson Comorbidity Index 1    1% Active ulcer inpatient medication order present    1% Current length of stay 0.893 days

## 2023-09-03 NOTE — Progress Notes (Unsigned)
 09/04/2023 5:05 PM   Christian Thompson 05/26/1950 [id number]  Referring provider: Steva Clotilda DEL, NP [address] [place],  [place] [address]  Urological history: 1. Elevated PSA -  PSA (05/2023) 3.2 -  bx (2014) suspicious at the right lateral base  2. Right pelvic lipoma - pelvic MRI (04/2023) - minimal growth overall  3. BPH  4. Right renal cysts - contrast CT (07/2023) Several right-sided cystic lesions, measuring up to 2.7 cm in the right kidney superior pole and 3.0 cm in the right kidney inferior pole   No chief complaint on file.  HPI: Christian Thompson is a 73 y.o. man who presents today for urinary frequency.    Previous records reviewed.   He scheduled an appointment for urinary frequency, but he was admitted prior to his appointment on 08/19/2023.  He presented to [facility] with an AKI secondary to urinary retention.  A foley catheter was placed for 700 ml return.  He was restarted on tamsulosin  and discharged with the catheter.  He was also found to have a positive culture for proteus.  CT w/ contrast performed during admission noted bilateral hydronephrosis and thickening of the bladder wall at the UO's.    He then he had a traumatic Foley removal on 08/28/2023.    He was then admitted for delirium secondary to polypharmacy.    Serum creatinine (08/2023) 1.12, eGFR 69  PMH: Past Medical History:  Diagnosis Date   Acid reflux 03/07/2011   Anxiety    Arteriosclerosis of coronary artery 03/07/2011   Overview:  Non ST-elevation myocardial infarction.  Status post coronary artery bypass grafting x4 with left internal mammary artery to left anterior descending artery, saphenous vein graft to posterior descending artery, obtuse marginal 2 and obtuse marginal 1 individually on 02/10/2011, by [facility].    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     GERD (gastroesophageal reflux disease)    History of hiatal hernia    History of kidney stones    HLD (hyperlipidemia) 04/29/2011   Overview:  Formatting of this note may be different from the original. Component     Latest Ref Rng 02/10/2011 05/02/2011  Cholesterol, Total      171 114  Triglyceride      64 111  HDL Cholesterol      46 50  Low Density Lipoprotein      105 42     Hyperlipidemia    Hypertension    IGT (impaired glucose tolerance)    Lipids serum increased    Low vitamin D level    Myocardial infarction (HCC)    Parkinson's disease (HCC) 03/07/2011   Pelvic mass    Peripheral edema    Renal disorder    Restless leg 03/21/2012   RLS (restless legs syndrome)    Situational depression     Surgical History: Past Surgical History:  Procedure Laterality Date   CARDIAC CATHETERIZATION     COLONOSCOPY WITH PROPOFOL  N/A 09/29/2014   Procedure: COLONOSCOPY WITH PROPOFOL ;  Surgeon: Gladis RAYMOND Mariner, MD;  Location: [facility] ENDOSCOPY;  Service: Endoscopy;  Laterality: N/A;   COLONOSCOPY WITH PROPOFOL  N/A 12/25/2017  Procedure: COLONOSCOPY WITH PROPOFOL ;  Surgeon: Gaylyn Gladis PENNER, MD;  Location: [facility] ENDOSCOPY;  Service: Endoscopy;  Laterality: N/A;   COLONOSCOPY WITH PROPOFOL  N/A 04/11/2023   Procedure: COLONOSCOPY WITH PROPOFOL ;  Surgeon: Maryruth Ole DASEN, MD;  Location: [facility] ENDOSCOPY;  Service: Endoscopy;  Laterality: N/A;   CORONARY ARTERY BYPASS GRAFT     ESOPHAGOGASTRODUODENOSCOPY (EGD) WITH PROPOFOL  N/A 09/29/2014   Procedure: ESOPHAGOGASTRODUODENOSCOPY (EGD) WITH PROPOFOL ;  Surgeon: Gladis PENNER Gaylyn, MD;  Location: [facility] ENDOSCOPY;  Service: Endoscopy;  Laterality: N/A;   ESOPHAGOGASTRODUODENOSCOPY (EGD) WITH PROPOFOL  N/A 06/26/2015   Procedure: ESOPHAGOGASTRODUODENOSCOPY (EGD) WITH PROPOFOL ;  Surgeon: Gladis PENNER Gaylyn, MD;  Location: [facility] ENDOSCOPY;  Service: Endoscopy;  Laterality: N/A;   HAND SURGERY     hemmorrhoidectomy     HERNIA REPAIR     LITHOTRIPSY     NEUROPLASTY / TRANSPOSITION MEDIAN NERVE AT CARPAL TUNNEL Left     Home Medications:  Allergies as of 09/04/2023       Reactions   Penicillins    Tramadol         Medication List        Accurate as of September 03, 2023  5:05 PM. If you have any questions, ask your nurse or doctor.          amitriptyline 10 MG tablet Commonly known as: ELAVIL Take 10 mg by mouth at bedtime.   ascorbic acid 500 MG tablet Commonly known as: VITAMIN C Take 500 mg by mouth daily.   atorvastatin 80 MG tablet Commonly known as: LIPITOR Take 80 mg by mouth daily.   Baclofen  5 MG Tabs Take 1 tablet (5 mg total) by mouth 3 (three) times daily as needed.   benzonatate 100 MG capsule Commonly known as: TESSALON Take by mouth.   carbidopa-levodopa 25-100 MG tablet Commonly known as: SINEMET IR Take 1 tablet by mouth 3 (three) times daily.   Cholecalciferol 50 MCG (2000 UT) Caps Take by mouth.   citalopram 10 MG tablet Commonly known as: CELEXA Take 10 mg by mouth daily.   cyanocobalamin 1000 MCG tablet Take 100 mcg by mouth daily.   diazepam  5 MG tablet Commonly known as: Valium  Take 1 tablet 30 minutes prior to MRI   diclofenac  Sodium 1 % Gel Commonly known as: VOLTAREN  Apply 2 g topically 4 (four) times daily.   GaviLyte-N with Flavor Pack 420 g solution Generic drug: polyethylene glycol-electrolytes TAKE 4,000 MLS BY MOUTH AS DIRECTED   glucosamine-chondroitin 500-400 MG tablet Take 1 tablet by mouth 3 (three) times daily.   hyoscyamine 0.375 MG 12 hr tablet Commonly known as: LEVBID Take by mouth.   lisinopril 5 MG tablet Commonly known as: ZESTRIL Take 5 mg by mouth daily.   metFORMIN 500 MG 24 hr tablet Commonly known as: GLUCOPHAGE-XR Take 500 mg by mouth daily with breakfast.   metoprolol succinate 25  MG 24 hr tablet Commonly known as: TOPROL-XL Take 25 mg by mouth daily.   multivitamin tablet Take 1 tablet by mouth daily.   mupirocin  ointment 2 % Commonly known as: BACTROBAN  Apply two times a day for 7 days.   nitroGLYCERIN 0.4 MG SL tablet Commonly known as: NITROSTAT Place 0.4 mg under the tongue every 5 (five) minutes as needed for chest pain.   omega-3 acid ethyl esters 1 g capsule Commonly known as: LOVAZA Take by mouth 2 (two) times daily.   pantoprazole 40 MG tablet Commonly known as: PROTONIX Take 40 mg by mouth daily.   pioglitazone  30 MG tablet Commonly known as: ACTOS Take 30 mg by mouth daily.   rOPINIRole 0.5 MG tablet Commonly known as: REQUIP Take 0.5 mg by mouth 3 (three) times daily.   torsemide 10 MG tablet Commonly known as: DEMADEX Take 1 tablet by mouth daily.   triamcinolone 55 MCG/ACT Aero nasal inhaler Commonly known as: NASACORT Place 2 sprays into the nose daily.        Allergies:  Allergies  Allergen Reactions   Penicillins    Tramadol     Family History: Family History  Problem Relation Age of Onset   Asthma Mother    Emphysema Mother    Alzheimer's disease Father     Social History: See HPI for pertinent social history  ROS: Pertinent ROS in HPI  Physical Exam: There were no vitals taken for this visit.  Constitutional:  Well nourished. Alert and oriented, No acute distress. HEENT: [place] AT, moist mucus membranes.  Trachea midline, no masses. Cardiovascular: No clubbing, cyanosis, or edema. Respiratory: Normal respiratory effort, no increased work of breathing. GI: Abdomen is soft, non tender, non distended, no abdominal masses. Liver and spleen not palpable.  No hernias appreciated.  Stool sample for occult testing is not indicated.   GU: No CVA tenderness.  No bladder fullness or masses.  Patient with circumcised/uncircumcised phallus. ***Foreskin easily retracted***  Urethral meatus is patent.  No penile  discharge. No penile lesions or rashes. Scrotum without lesions, cysts, rashes and/or edema.  Testicles are located scrotally bilaterally. No masses are appreciated in the testicles. Left and right epididymis are normal. Rectal: Patient with  normal sphincter tone. Anus and perineum without scarring or rashes. No rectal masses are appreciated. Prostate is approximately *** grams, *** nodules are appreciated. Seminal vesicles are normal. Skin: No rashes, bruises or suspicious lesions. Lymph: No cervical or inguinal adenopathy. Neurologic: Grossly intact, no focal deficits, moving all 4 extremities. Psychiatric: Normal mood and affect.  Laboratory Data: See EPIC and HPI  I have reviewed the labs.   Pertinent Imaging: CT Abdomen Pelvis W IV Contrast  Anatomical Region Laterality Modality  Abdomen -- Computed Tomography  Pelvis -- --   Impression  1.  New mild-to-moderate left greater than right hydroureteronephrosis, to the level of the ureterovesicular junctions bilaterally. The urinary bladder is decompressed with a Foley catheter. Bilateral hydroureteronephrosis, may be secondary from mass effect by the enlarged prostate and the right obturator canal lipomatous lesion. Additionally, wall thickening of the bladder wall at the uterovesical junctions may be a source of obstruction. 2.  Mild urothelial thickening of the bilateral ureters is nonspecific, but could suggest ascending urinary tract infection, particularly given circumferential bladder wall thickening. Recommend correlation with urinalysis. 3.  Additional nonspecific subcentimeter focus of enhancement in the left distal ureter, which may represent a component of left ureteral infectious/inflammatory process versus focal lesion. 4.  Similar right pelvic sidewall lipomatous lesion, with internal complexity, demonstrating moderate mass effect on the rectosigmoid junction and prostate. Recommend further evaluation with nonemergent pelvis  MRI to further characterize. No upstream colonic obstruction. 5.  Similar bladder calculi. 6.  Colonic diverticulosis without evidence of diverticulitis. 7.  Mild mosaic attenuation of the bilateral lung bases, which can be seen in the setting of small airways disease.    The initial findings of this study, including impression #1 and 2, were discussed via telephone with DR. LYNDSAY OAKESON by Dr. Lonni Salinas on 08/19/2023 5:28 AM.  ==================== MODIFIED REPORT: (08/19/2023 9:09 AM) This report has been  modified from its preliminary version; you may check the prior versions of radiology report, results history link for prior report versions (if they were previously visible in Epic).  ----------------------------------------------- Narrative  EXAM: CT ABDOMEN PELVIS W CONTRAST ACCESSION: [id number] UN REPORT DATE: 08/19/2023 4:59 AM CLINICAL INDICATION: 73 years old with lower abdominal pain, elev WBC, constipation    COMPARISON: CT abdomen/pelvis 08/11/2023  TECHNIQUE: A helical CT scan of the abdomen and pelvis was obtained following IV contrast from the lung bases through the pubic symphysis. Images were reconstructed in the axial plane. Coronal and sagittal reformatted images were also provided for further evaluation.   FINDINGS:  LOWER CHEST: Mosaic attenuation of the lung bases, possibly respiratory artifact. Coronary calcifications.  LIVER: Normal liver contour. Focal steatosis along the falciform ligament.  BILIARY: Cholelithiasis without CT evidence of acute cholecystitis. No biliary ductal dilatation.    SPLEEN: Normal in size and contour.  PANCREAS: Normal pancreatic contour.  No focal lesions.  No ductal dilation.  ADRENAL GLANDS: Nodular thickening of the bilateral adrenal glands measuring up to 1.0 cm in the left.  KIDNEYS/URETERS: Symmetric renal enhancement.  No hydronephrosis.  No solid renal mass. Several right-sided cystic lesions, measuring up  to 2.7 cm in the right kidney superior pole (3:31) and 3.0 cm in the right kidney inferior pole (2:62). These demonstrate possible thin internal septations and appears similar to prior examinations. Several subcentimeter hypoattenuating lesions too small to characterize.  Mild-to-moderate left greater than right hydroureteronephrosis, leading to the ureterovesicular junctions bilaterally. This is associated with mild urothelial thickening and enhancement of the ureters, as well as mild bilateral perinephric fat stranding. Focal 6 mm enhancement in the left distal ureter (2:108  BLADDER: Nondistended with indwelling urinary catheter in place. There are several layering stones, most prominently along the right greater than ureterovesical junctions. There is circumferential bladder wall thickening with adjacent fat stranding. There is tenting of the anterior bladder by the Foley catheter.  REPRODUCTIVE ORGANS: Prostatomegaly, measuring up to 7.7 cm in craniocaudal dimension and exerting significant mass effect on the bladder. Small hypoattenuating BPH nodule in the right lateral prostate (2:122), measuring to 0.7 cm  GI TRACT: Small hiatal hernia. Normal course of the duodenum. No evidence of bowel obstruction. Colonic diverticulosis. The rectosigmoid junction is moderately displaced laterally by the large lipomatous mass described below. Normal appendix.  PERITONEUM, RETROPERITONEUM AND MESENTERY: No free air.  No ascites.  No fluid collection. Calcified density in the left lower quadrant (2:103).  LYMPH NODES: No adenopathy.  VESSELS: Hepatic and portal veins are patent.  Normal caliber aorta with calcified atherosclerosis.    BONES and SOFT TISSUES: Diffuse osseous demineralization. Multilevel degenerative images of the spine. Bilateral sacroiliac osteoarthrosis with prominent anterior osteophytes (2:96). Somewhat complex appearing lipomatous lesion with internal septations in the right obturator  canal (2:137), measuring 10.7 x  9.0 x 7.7 cm. This displaces the prostate anteriorly against the posterior bladder. Sequela of right inguinal hernia mesh repair. Bilateral fat-containing inguinal hernias. Procedure Note  Ernestine Savant, MD - 08/19/2023 Formatting of this note might be different from the original. EXAM: CT ABDOMEN PELVIS W CONTRAST ACCESSION: [id number] UN REPORT DATE: 08/19/2023 4:59 AM CLINICAL INDICATION: 73 years old with lower abdominal pain, elev WBC, constipation    COMPARISON: CT abdomen/pelvis 08/11/2023  TECHNIQUE: A helical CT scan of the abdomen and pelvis was obtained following IV contrast from the lung bases through the pubic symphysis. Images were reconstructed in the axial plane. Coronal and sagittal reformatted images were also  provided for further evaluation.   FINDINGS:  LOWER CHEST: Mosaic attenuation of the lung bases, possibly respiratory artifact. Coronary calcifications.  LIVER: Normal liver contour. Focal steatosis along the falciform ligament.  BILIARY: Cholelithiasis without CT evidence of acute cholecystitis. No biliary ductal dilatation.    SPLEEN: Normal in size and contour.  PANCREAS: Normal pancreatic contour.  No focal lesions.  No ductal dilation.  ADRENAL GLANDS: Nodular thickening of the bilateral adrenal glands measuring up to 1.0 cm in the left.  KIDNEYS/URETERS: Symmetric renal enhancement.  No hydronephrosis.  No solid renal mass. Several right-sided cystic lesions, measuring up to 2.7 cm in the right kidney superior pole (3:31) and 3.0 cm in the right kidney inferior pole (2:62). These demonstrate possible thin internal septations and appears similar to prior examinations. Several subcentimeter hypoattenuating lesions too small to characterize.  Mild-to-moderate left greater than right hydroureteronephrosis, leading to the ureterovesicular junctions bilaterally. This is associated with mild urothelial thickening and enhancement  of the ureters, as well as mild bilateral perinephric fat stranding. Focal 6 mm enhancement in the left distal ureter (2:108  BLADDER: Nondistended with indwelling urinary catheter in place. There are several layering stones, most prominently along the right greater than ureterovesical junctions. There is circumferential bladder wall thickening with adjacent fat stranding. There is tenting of the anterior bladder by the Foley catheter.  REPRODUCTIVE ORGANS: Prostatomegaly, measuring up to 7.7 cm in craniocaudal dimension and exerting significant mass effect on the bladder. Small hypoattenuating BPH nodule in the right lateral prostate (2:122), measuring to 0.7 cm  GI TRACT: Small hiatal hernia. Normal course of the duodenum. No evidence of bowel obstruction. Colonic diverticulosis. The rectosigmoid junction is moderately displaced laterally by the large lipomatous mass described below. Normal appendix.  PERITONEUM, RETROPERITONEUM AND MESENTERY: No free air.  No ascites.  No fluid collection. Calcified density in the left lower quadrant (2:103).   LYMPH NODES: No adenopathy.  VESSELS: Hepatic and portal veins are patent.  Normal caliber aorta with calcified atherosclerosis.    BONES and SOFT TISSUES: Diffuse osseous demineralization. Multilevel degenerative images of the spine. Bilateral sacroiliac osteoarthrosis with prominent anterior osteophytes (2:96). Somewhat complex appearing lipomatous lesion with internal septations in the right obturator canal (2:137), measuring 10.7 x  9.0 x 7.7 cm. This displaces the prostate anteriorly against the posterior bladder. Sequela of right inguinal hernia mesh repair. Bilateral fat-containing inguinal hernias.  IMPRESSION: 1.  New mild-to-moderate left greater than right hydroureteronephrosis, to the level of the ureterovesicular junctions bilaterally. The urinary bladder is decompressed with a Foley catheter. Bilateral hydroureteronephrosis, may be secondary  from mass effect by the enlarged prostate and the right obturator canal lipomatous lesion. Additionally, wall thickening of the bladder wall at the uterovesical junctions may be a source of obstruction. 2.  Mild urothelial thickening of the bilateral ureters is nonspecific, but could suggest ascending urinary tract infection, particularly given circumferential bladder wall thickening. Recommend correlation with urinalysis. 3.  Additional nonspecific subcentimeter focus of enhancement in the left distal ureter, which may represent a component of left ureteral infectious/inflammatory process versus focal lesion. 4.  Similar right pelvic sidewall lipomatous lesion, with internal complexity, demonstrating moderate mass effect on the rectosigmoid junction and prostate. Recommend further evaluation with nonemergent pelvis MRI to further characterize. No upstream colonic obstruction. 5.  Similar bladder calculi. 6.  Colonic diverticulosis without evidence of diverticulitis. 7.  Mild mosaic attenuation of the bilateral lung bases, which can be seen in the setting of small airways disease.  The initial findings of this study, including impression #1 and 2, were discussed via telephone with DR. LYNDSAY OAKESON by Dr. Lonni Salinas on 08/19/2023 5:28 AM.  ==================== MODIFIED REPORT: (08/19/2023 9:09 AM) This report has been modified from its preliminary version; you may check the prior versions of radiology report, results history link for prior report versions (if they were previously visible in Epic).  ----------------------------------------------- Exam End: 08/19/23 04:49   Specimen Collected: 08/19/23 04:59 Last Resulted: 08/19/23 09:14  Received From: [facility]  Result Received: 09/03/23 17:05   View Encounter    Assessment & Plan:  ***  1. Urinary retention - explained that we will need to have an appointment for TOV and he will need to continue with the catheter until  that appointment - continue tamsulosin  0.4 mg daily   2. Bilateral hydronephrosis - may be secondary to urinary retention, but findings on the recent CT suggests that the bladder wall thickening may also be contributing to the bilateral hydronephrosis - we will need to obtain a renal US  to ensure the hydronephrosis has resolved with the placement of the Foley catheter  No follow-ups on file.  These notes generated with voice recognition software. I apologize for typographical errors.  CLOTILDA HELON RIGGERS  [facility] [address]  [address] [place], [place] [address] [phone]

## 2023-09-04 ENCOUNTER — Ambulatory Visit: Admitting: Urology

## 2023-09-04 ENCOUNTER — Encounter: Payer: Self-pay | Admitting: Urology

## 2023-09-04 VITALS — BP 138/81 | HR 73

## 2023-09-04 DIAGNOSIS — N1339 Other hydronephrosis: Secondary | ICD-10-CM

## 2023-09-04 DIAGNOSIS — R339 Retention of urine, unspecified: Secondary | ICD-10-CM

## 2023-09-07 NOTE — Progress Notes (Signed)
 09/11/2023 4:21 PM   Christian Thompson September 04, 1950 [id number]  Referring provider: Steva Clotilda DEL, NP [address] [place],  [place] [address]  Urological history: 1. Elevated PSA -  PSA (05/2023) 3.2 -  bx (2014) suspicious at the right lateral base  2. Right pelvic lipoma - pelvic MRI (04/2023) - minimal growth overall  3. BPH  4. Right renal cysts - contrast CT (07/2023) Several right-sided cystic lesions, measuring up to 2.7 cm in the right kidney superior pole and 3.0 cm in the right kidney inferior pole   Chief Complaint  Patient presents with   foley catherer removal   HPI: Christian Thompson is a 73 y.o. man who presents today for TOV with his wife, Christian Thompson.    Previous records reviewed.   At his appointment on 09/04/2023, he scheduled an appointment for urinary frequency, but he was admitted prior to his appointment on 08/19/2023.  He presented to [facility] with an AKI secondary to urinary retention.  A foley catheter was placed for 700 ml return.  He was restarted on tamsulosin  and discharged with the catheter.  He was also found to have a positive culture for proteus.  CT w/ contrast performed during admission noted bilateral hydronephrosis and thickening of the bladder wall at the UO's.  He then he had a traumatic Foley removal on 08/28/2023.  He was then admitted for delirium secondary to polypharmacy.  Serum creatinine (08/2023) 1.12, eGFR 69.  He has his Foley catheter placed and it was draining clear yellow urine.  He states it bothers him.  It pinches him and he cannot get sleep.   He has not had any fevers, chills, nausea or vomiting.   A renal ultrasound was ordered and he was scheduled for a voiding trial.  Foley catheter was removed this am.  Renal ultrasound scheduled for tomorrow.  No new labs since last visit.   PMH: Past Medical History:  Diagnosis Date   Acid reflux 03/07/2011   Anxiety    Arteriosclerosis of coronary artery 03/07/2011    Overview:  Non ST-elevation myocardial infarction.  Status post coronary artery bypass grafting x4 with left internal mammary artery to left anterior descending artery, saphenous vein graft to posterior descending artery, obtuse marginal 2 and obtuse marginal 1 individually on 02/10/2011, by [facility].    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     GERD (gastroesophageal reflux disease)    History of hiatal hernia    History of kidney stones    HLD (hyperlipidemia) 04/29/2011   Overview:  Formatting of this note may be different from the original. Component     Latest Ref Rng 02/10/2011 05/02/2011  Cholesterol, Total      171 114  Triglyceride      64 111  HDL Cholesterol      46 50  Low Density Lipoprotein      105 42     Hyperlipidemia    Hypertension    IGT (impaired glucose tolerance)    Lipids serum increased    Low vitamin D level    Myocardial infarction (HCC)    Parkinson's disease (HCC) 03/07/2011  Pelvic mass    Peripheral edema    Renal disorder    Restless leg 03/21/2012   RLS (restless legs syndrome)    Situational depression     Surgical History: Past Surgical History:  Procedure Laterality Date   CARDIAC CATHETERIZATION     COLONOSCOPY WITH PROPOFOL  N/A 09/29/2014   Procedure: COLONOSCOPY WITH PROPOFOL ;  Surgeon: Gladis RAYMOND Mariner, MD;  Location: [facility] ENDOSCOPY;  Service: Endoscopy;  Laterality: N/A;   COLONOSCOPY WITH PROPOFOL  N/A 12/25/2017   Procedure: COLONOSCOPY WITH PROPOFOL ;  Surgeon: Mariner Gladis RAYMOND, MD;  Location: [facility] ENDOSCOPY;  Service: Endoscopy;  Laterality: N/A;   COLONOSCOPY WITH PROPOFOL  N/A 04/11/2023   Procedure: COLONOSCOPY WITH PROPOFOL ;  Surgeon: Maryruth Ole DASEN,  MD;  Location: [facility] ENDOSCOPY;  Service: Endoscopy;  Laterality: N/A;   CORONARY ARTERY BYPASS GRAFT     ESOPHAGOGASTRODUODENOSCOPY (EGD) WITH PROPOFOL  N/A 09/29/2014   Procedure: ESOPHAGOGASTRODUODENOSCOPY (EGD) WITH PROPOFOL ;  Surgeon: Gladis RAYMOND Mariner, MD;  Location: [facility] ENDOSCOPY;  Service: Endoscopy;  Laterality: N/A;   ESOPHAGOGASTRODUODENOSCOPY (EGD) WITH PROPOFOL  N/A 06/26/2015   Procedure: ESOPHAGOGASTRODUODENOSCOPY (EGD) WITH PROPOFOL ;  Surgeon: Gladis RAYMOND Mariner, MD;  Location: [facility] ENDOSCOPY;  Service: Endoscopy;  Laterality: N/A;   HAND SURGERY     hemmorrhoidectomy     HERNIA REPAIR     LITHOTRIPSY     NEUROPLASTY / TRANSPOSITION MEDIAN NERVE AT CARPAL TUNNEL Left     Home Medications:  Allergies as of 09/11/2023       Reactions   Penicillins Dermatitis, Hives   Had reaction in the 1990s if not earlier   Tramadol         Medication List        Accurate as of September 11, 2023  4:21 PM. If you have any questions, ask your nurse or doctor.          STOP taking these medications    cyanocobalamin 1000 MCG tablet Stopped by: CLOTILDA CORNWALL   glucosamine-chondroitin 500-400 MG tablet Stopped by: CLOTILDA Nalda Shackleford       TAKE these medications    acetaminophen  325 MG tablet Commonly known as: TYLENOL  Take 650 mg by mouth every 4 (four) hours as needed.   ascorbic acid 500 MG tablet Commonly known as: VITAMIN C Take 500 mg by mouth daily.   atorvastatin 80 MG tablet Commonly known as: LIPITOR Take 80 mg by mouth daily.   Baclofen  5 MG Tabs Take 1 tablet (5 mg total) by mouth 3 (three) times daily as needed.   benzonatate 100 MG capsule Commonly known as: TESSALON Take by mouth.   Carbidopa-Levodopa ER 25-100 MG tablet controlled release Commonly known as: SINEMET CR Take 1-2 tablets by mouth at bedtime.   Cholecalciferol 50 MCG (2000 UT) Caps Take by mouth.   citalopram 20 MG tablet Commonly known as: CELEXA Take 20 mg by mouth daily.    gabapentin 100 MG capsule Commonly known as: NEURONTIN Take 100 mg by mouth at bedtime.   GaviLyte-N with Flavor Pack 420 g solution Generic drug: polyethylene glycol-electrolytes TAKE 4,000 MLS BY MOUTH AS DIRECTED   hyoscyamine 0.375 MG 12 hr tablet Commonly known as: LEVBID Take by mouth.   lisinopril 5 MG tablet Commonly known as: ZESTRIL Take 5 mg by mouth daily.   metFORMIN 500 MG 24 hr tablet Commonly known as: GLUCOPHAGE-XR Take 500 mg by mouth daily with breakfast.   metoprolol succinate 25 MG 24 hr tablet Commonly known as: TOPROL-XL Take 25 mg by mouth daily.  mirtazapine 7.5 MG tablet Commonly known as: REMERON Take 7.5 mg by mouth at bedtime.   multivitamin tablet Take 1 tablet by mouth daily.   nitroGLYCERIN 0.4 MG SL tablet Commonly known as: NITROSTAT Place 0.4 mg under the tongue every 5 (five) minutes as needed for chest pain.   omega-3 acid ethyl esters 1 g capsule Commonly known as: LOVAZA Take by mouth 2 (two) times daily.   pantoprazole 40 MG tablet Commonly known as: PROTONIX Take 40 mg by mouth daily.   pioglitazone 30 MG tablet Commonly known as: ACTOS Take 30 mg by mouth daily.   polyethylene glycol 17 g packet Commonly known as: MIRALAX / GLYCOLAX Take 17 g by mouth daily as needed.   rOPINIRole 0.5 MG tablet Commonly known as: REQUIP Take 0.5 mg by mouth 3 (three) times daily.   senna 8.6 MG tablet Commonly known as: SENOKOT Take 2 tablets by mouth at bedtime.   tamsulosin  0.4 MG Caps capsule Commonly known as: FLOMAX  Take 0.4 mg by mouth daily.   torsemide 10 MG tablet Commonly known as: DEMADEX Take 1 tablet by mouth daily.        Allergies:  Allergies  Allergen Reactions   Penicillins Dermatitis and Hives    Had reaction in the 1990s if not earlier   Tramadol     Family History: Family History  Problem Relation Age of Onset   Asthma Mother    Emphysema Mother    Alzheimer's disease Father      Social History: See HPI for pertinent social history  ROS: Pertinent ROS in HPI  Physical Exam: BP 115/73 (BP Location: Left Arm, Patient Position: Sitting, Cuff Size: Normal)   Pulse 69   Ht 5' 5 (1.651 m)   Wt 162 lb (73.5 kg)   SpO2 93%   BMI 26.96 kg/m   Constitutional:  Well nourished. Alert and oriented, No acute distress. HEENT: [place] AT, moist mucus membranes.  Trachea midline Cardiovascular: No clubbing, cyanosis, or edema. Respiratory: Normal respiratory effort, no increased work of breathing. Neurologic: Grossly intact, no focal deficits, moving all 4 extremities.  In wheelchair  Psychiatric: Normal mood and affect.   Laboratory Data: See EPIC and HPI  I have reviewed the labs.  Procedure: Catheter Removal Patient is present today for a catheter removal.  9 ml of water was drained from the balloon. A 18 FR Coude foley cath was removed from the bladder, no complications were noted. Patient tolerated well.  Performed by: CLOTILDA CORNWALL, PA-C   Pertinent Imaging: N/A  Assessment & Plan:    1. Urinary retention - Foley catheter was removed this morning, but he did not present for afternoon office visit for PVR - We attempted to reach out via phone calls and did not get response  2. Bilateral hydronephrosis - RUS pending  These notes generated with voice recognition software. I apologize for typographical errors.  CLOTILDA CORNWALL RIGGERS  [facility] [address]  [address] [place], [place] [address] [phone]

## 2023-09-08 ENCOUNTER — Other Ambulatory Visit: Payer: Self-pay

## 2023-09-08 DIAGNOSIS — Z466 Encounter for fitting and adjustment of urinary device: Secondary | ICD-10-CM

## 2023-09-11 ENCOUNTER — Encounter: Payer: Self-pay | Admitting: Urology

## 2023-09-11 ENCOUNTER — Ambulatory Visit (INDEPENDENT_AMBULATORY_CARE_PROVIDER_SITE_OTHER): Admitting: Urology

## 2023-09-11 ENCOUNTER — Other Ambulatory Visit
Admission: RE | Admit: 2023-09-11 | Discharge: 2023-09-11 | Disposition: A | Discharge status: Home / Self Care | Attending: Urology | Admitting: Urology

## 2023-09-11 ENCOUNTER — Ambulatory Visit: Admitting: Urology

## 2023-09-11 VITALS — BP 115/73 | HR 69 | Ht 65.0 in | Wt 162.0 lb

## 2023-09-11 DIAGNOSIS — Z87898 Personal history of other specified conditions: Secondary | ICD-10-CM | POA: Insufficient documentation

## 2023-09-11 DIAGNOSIS — N1339 Other hydronephrosis: Secondary | ICD-10-CM | POA: Diagnosis not present

## 2023-09-11 DIAGNOSIS — R972 Elevated prostate specific antigen [PSA]: Secondary | ICD-10-CM | POA: Diagnosis present

## 2023-09-11 DIAGNOSIS — R339 Retention of urine, unspecified: Secondary | ICD-10-CM | POA: Diagnosis not present

## 2023-09-11 LAB — PSA: Prostatic Specific Antigen: 5.35 ng/mL — ABNORMAL HIGH (ref 0.00–4.00)

## 2023-09-11 NOTE — Addendum Note (Signed)
 Addended by: CLOIS TRENT POUR on: 09/11/2023 04:05 PM   Modules accepted: Orders

## 2023-09-12 ENCOUNTER — Ambulatory Visit
Admission: RE | Admit: 2023-09-12 | Discharge: 2023-09-12 | Disposition: A | Discharge status: Home / Self Care | Source: Ambulatory Visit | Attending: Urology | Admitting: Urology

## 2023-09-12 DIAGNOSIS — N1339 Other hydronephrosis: Secondary | ICD-10-CM | POA: Diagnosis present

## 2023-09-14 ENCOUNTER — Other Ambulatory Visit: Payer: Self-pay | Admitting: Urology

## 2023-09-15 ENCOUNTER — Telehealth: Payer: Self-pay

## 2023-09-15 NOTE — Telephone Encounter (Signed)
 Retutrned wife

## 2023-09-15 NOTE — Telephone Encounter (Signed)
 Contacted patient via phone and talked with patient. Patient states he has been able to void over night and is feeling much better. We also discussed that if he goes into retention again or has any worsening symptoms that they need to go to the ER for treatment. Also let patient know that results for scan are not back yet and that they will be contacted once they have been received.

## 2023-09-19 ENCOUNTER — Ambulatory Visit: Payer: Self-pay | Admitting: Physician Assistant

## 2023-09-20 ENCOUNTER — Ambulatory Visit: Payer: Self-pay

## 2023-09-20 NOTE — Telephone Encounter (Signed)
 FYI Only or Action Required?: FYI only for provider.  Patient was last seen in primary care on n/a.  Called Nurse Triage reporting Leg Swelling.  Symptoms began about a month ago.  Interventions attempted: Rest, hydration, or home remedies.  Symptoms are: gradually worsening.  Triage Disposition: See Physician Within 24 Hours- Patient will call PCP at [facility] to get an appt. Does not want to establish primary care with [facility] at this time.   Patient/caregiver understands and will follow disposition?: Yes Copied from CRM [id number]. Topic: Clinical - Red Word Triage >> Sep 20, 2023  3:37 PM Miquel SAILOR wrote: Red Word that prompted transfer to Nurse Triage: Christian he RN Report fall no injurys last 07/24.  concern fluid retention on both lower legs. PT went to emergency  on 07/23 for UTI. Weight 146 but looks lik it has gotten down 141 on 07/09. history of heart attacks before. [phone] Reason for Disposition  [1] MODERATE leg swelling (e.g., swelling extends up to knees) AND [2] new-onset or getting worse  Answer Assessment - Initial Assessment Questions 1. ONSET: When did the swelling start? (e.g., minutes, hours, days)     Swelling started last month  2. LOCATION: What part of the leg is swollen?  Are both legs swollen or just one leg?     Both lower legs  3. SEVERITY: How bad is the swelling? (e.g., localized; mild, moderate, severe)     Moderate swelling from knee down  4. REDNESS: Is there redness or signs of infection?     No redness  5. PAIN: Is the swelling painful to touch? If Yes, ask: How painful is it?   (Scale 1-10; mild, moderate or severe)     Mild to moderate  6. FEVER: Do you have a fever? If Yes, ask: What is it, how was it measured, and when did it start?      No  7. CAUSE: What do you think is causing the leg swelling?     Concerned for fluid retention  8. MEDICAL HISTORY: Do you have a history of blood clots (e.g., DVT),  cancer, heart failure, kidney disease, or liver failure?     CAGB 2012, urinates with catheter  9. RECURRENT SYMPTOM: Have you had leg swelling before? If Yes, ask: When was the last time? What happened that time?     Yes, hwhen Thompson had a heart attack  10. OTHER SYMPTOMS: Do you have any other symptoms? (e.g., chest pain, difficulty breathing)       Has gained 5lbs in last 3 weeks.  Protocols used: Leg Swelling and Edema-A-AH

## 2023-12-23 DEATH — deceased

## 2024-05-24 ENCOUNTER — Other Ambulatory Visit

## 2024-05-29 ENCOUNTER — Ambulatory Visit: Admitting: Urology
